# Patient Record
Sex: Male | Born: 1970 | Hispanic: Yes | Marital: Married | Smoking: Former smoker
Health system: Southern US, Community
[De-identification: ages and names within clinical notes are randomized; demographics above are authoritative.]

## PROBLEM LIST (undated history)

## (undated) ENCOUNTER — Ambulatory Visit: Admission: EM | Payer: BC Managed Care – PPO | Source: Home / Self Care

## (undated) DIAGNOSIS — I10 Essential (primary) hypertension: Secondary | ICD-10-CM

## (undated) DIAGNOSIS — Z21 Asymptomatic human immunodeficiency virus [HIV] infection status: Secondary | ICD-10-CM

## (undated) DIAGNOSIS — B20 Human immunodeficiency virus [HIV] disease: Secondary | ICD-10-CM

## (undated) HISTORY — DX: Human immunodeficiency virus (HIV) disease: B20

## (undated) HISTORY — DX: Asymptomatic human immunodeficiency virus (hiv) infection status: Z21

## (undated) HISTORY — DX: Essential (primary) hypertension: I10

---

## 1997-06-10 HISTORY — PX: APPENDECTOMY: SHX54

## 2018-07-29 ENCOUNTER — Other Ambulatory Visit: Payer: Self-pay

## 2018-07-29 ENCOUNTER — Ambulatory Visit: Payer: Self-pay

## 2018-07-29 DIAGNOSIS — Z79899 Other long term (current) drug therapy: Secondary | ICD-10-CM

## 2018-07-29 DIAGNOSIS — B20 Human immunodeficiency virus [HIV] disease: Secondary | ICD-10-CM

## 2018-07-29 DIAGNOSIS — Z113 Encounter for screening for infections with a predominantly sexual mode of transmission: Secondary | ICD-10-CM

## 2018-08-03 ENCOUNTER — Ambulatory Visit: Payer: Medicaid Other

## 2018-08-03 ENCOUNTER — Ambulatory Visit (INDEPENDENT_AMBULATORY_CARE_PROVIDER_SITE_OTHER): Payer: Medicaid Other | Admitting: Pharmacist

## 2018-08-03 ENCOUNTER — Other Ambulatory Visit (HOSPITAL_COMMUNITY)
Admission: RE | Admit: 2018-08-03 | Discharge: 2018-08-03 | Disposition: A | Discharge status: Home / Self Care | Payer: Medicaid Other | Source: Ambulatory Visit | Attending: Internal Medicine | Admitting: Internal Medicine

## 2018-08-03 ENCOUNTER — Encounter: Payer: Self-pay | Admitting: Internal Medicine

## 2018-08-03 ENCOUNTER — Other Ambulatory Visit: Payer: Medicaid Other

## 2018-08-03 DIAGNOSIS — Z113 Encounter for screening for infections with a predominantly sexual mode of transmission: Secondary | ICD-10-CM | POA: Insufficient documentation

## 2018-08-03 DIAGNOSIS — B20 Human immunodeficiency virus [HIV] disease: Secondary | ICD-10-CM

## 2018-08-03 DIAGNOSIS — Z79899 Other long term (current) drug therapy: Secondary | ICD-10-CM

## 2018-08-03 LAB — URINALYSIS
BILIRUBIN URINE: NEGATIVE
Glucose, UA: NEGATIVE
Hgb urine dipstick: NEGATIVE
Ketones, ur: NEGATIVE
Leukocytes,Ua: NEGATIVE
Nitrite: NEGATIVE
Protein, ur: NEGATIVE
Specific Gravity, Urine: 1.024 (ref 1.001–1.03)
pH: 6 (ref 5.0–8.0)

## 2018-08-03 MED ORDER — EMTRICITABINE-TENOFOVIR AF 200-25 MG PO TABS: 1.0000 | ORAL_TABLET | Freq: Every day | ORAL | 2 refills | Status: DC

## 2018-08-03 MED ORDER — DOLUTEGRAVIR SODIUM 50 MG PO TABS: 50.0000 mg | ORAL_TABLET | Freq: Every day | ORAL | 2 refills | Status: DC

## 2018-08-03 MED FILL — DESCOVY 200-25 MG TABS: 200-25 | 30 days supply | Qty: 30 | Fill #0

## 2018-08-03 MED FILL — TIVICAY 50 MG TABLET: 50 | 30 days supply | Qty: 30 | Fill #0

## 2018-08-03 NOTE — Progress Notes (Signed)
HPI: Mitchell Meyers is a 48 y.o. male who presents to the [facility] as a new patient.  There are no active problems to display for this patient.   Patient's Medications  New Prescriptions   DOLUTEGRAVIR (TIVICAY) 50 MG TABLET    Take 1 tablet (50 mg total) by mouth daily.   EMTRICITABINE-TENOFOVIR AF (DESCOVY) 200-25 MG TABLET    Take 1 tablet by mouth daily.  Previous Medications   No medications on file  Modified Medications   No medications on file  Discontinued Medications   No medications on file    Allergies: Allergies not on file  Past Medical History: No past medical history on file.  Social History: Social History   Socioeconomic History  . Marital status: Married    Spouse name: Not on file  . Number of children: Not on file  . Years of education: Not on file  . Highest education level: Not on file  Occupational History  . Not on file  Social Needs  . Financial resource strain: Not on file  . Food insecurity:    Worry: Not on file    Inability: Not on file  . Transportation needs:    Medical: Not on file    Non-medical: Not on file  Tobacco Use  . Smoking status: Not on file  Substance and Sexual Activity  . Alcohol use: Not on file  . Drug use: Not on file  . Sexual activity: Not on file  Lifestyle  . Physical activity:    Days per week: Not on file    Minutes per session: Not on file  . Stress: Not on file  Relationships  . Social connections:    Talks on phone: Not on file    Gets together: Not on file    Attends religious service: Not on file    Active member of club or organization: Not on file    Attends meetings of clubs or organizations: Not on file    Relationship status: Not on file  Other Topics Concern  . Not on file  Social History Narrative  . Not on file    Labs: No results found for: HIV1RNAQUANT, HIV1RNAVL, CD4TABS  RPR and STI No results found for: LABRPR, RPRTITER  No flowsheet data found.  Hepatitis B No  results found for: HEPBSAB, HEPBSAG, HEPBCAB Hepatitis C No results found for: HEPCAB, HCVRNAPCRQN Hepatitis A No results found for: HAV Lipids: No results found for: CHOL, TRIG, HDL, CHOLHDL, VLDL, LDLCALC  Current HIV Regimen: Tivicay + Descovy  Assessment: Mitchell Meyers is here today to establish care at [facility] as a transfer patient.  He is here today to get financial assistance and labs but asked to see me to get medication refills.  He is currently taking Tivicay and Descovy but asked that I just send in Tivicay refills.  I asked why and he states that he has been taking only Tivicay for the last 3 months. I asked him if it was because he ran out of Descovy and he said no that he was "just trying something new".  I explained how dangerous that was and that he can never take one medication without the other.  He just shrugged his shoulders and stated "whatever you say". I again emphasized the issue with taking one without the other.  I was going to switch him to [employer] but had no information on patient and wanted to wait until labs with resistance came back.  I will defer  to Dr. Luciana Axe to see if he needs a whole new regimen if he has developed resistance with only taking Tivicay.   I told him to take both until he sees Dr. Luciana Axe and he agreed to do so.  He is insured with Medicaid, so I will set him up to get his medications at [facility] through the mail.  Plan: - Take Tivicay and Descovy every day - F/u with Dr. Luciana Axe for a new patient visit on 3/17 at 9am  Cassie L. Kuppelweiser, PharmD, BCIDP, AAHIVP, CPP Infectious Diseases Clinical Pharmacist [facility] 08/03/2018, 10:47 AM

## 2018-08-04 LAB — URINE CYTOLOGY ANCILLARY ONLY
Chlamydia: NEGATIVE
Neisseria Gonorrhea: NEGATIVE

## 2018-08-04 LAB — T-HELPER CELL (CD4) - (RCID CLINIC ONLY)
CD4 % Helper T Cell: 44 % (ref 33–55)
CD4 T Cell Abs: 660 /uL (ref 400–2700)

## 2018-08-11 LAB — HIV ANTIBODY (ROUTINE TESTING W REFLEX): HIV 1&2 Ab, 4th Generation: REACTIVE — AB

## 2018-08-11 LAB — CBC WITH DIFFERENTIAL/PLATELET
Absolute Monocytes: 372 cells/uL (ref 200–950)
Basophils Absolute: 32 cells/uL (ref 0–200)
Basophils Relative: 0.5 %
Eosinophils Absolute: 120 cells/uL (ref 15–500)
Eosinophils Relative: 1.9 %
HCT: 41.2 % (ref 38.5–50.0)
Hemoglobin: 14.3 g/dL (ref 13.2–17.1)
Lymphs Abs: 1525 cells/uL (ref 850–3900)
MCH: 31.1 pg (ref 27.0–33.0)
MCHC: 34.7 g/dL (ref 32.0–36.0)
MCV: 89.6 fL (ref 80.0–100.0)
MPV: 9.7 fL (ref 7.5–12.5)
Monocytes Relative: 5.9 %
Neutro Abs: 4253 cells/uL (ref 1500–7800)
Neutrophils Relative %: 67.5 %
Platelets: 318 10*3/uL (ref 140–400)
RBC: 4.6 10*6/uL (ref 4.20–5.80)
RDW: 13 % (ref 11.0–15.0)
Total Lymphocyte: 24.2 %
WBC: 6.3 10*3/uL (ref 3.8–10.8)

## 2018-08-11 LAB — HLA B*5701: HLA-B*5701 w/rflx HLA-B High: NEGATIVE

## 2018-08-11 LAB — QUANTIFERON-TB GOLD PLUS
Mitogen-NIL: >10 IU/mL
NIL: 0.02 IU/mL
QuantiFERON-TB Gold Plus: NEGATIVE
TB1-NIL: 0.01 IU/mL
TB2-NIL: 0.02 IU/mL

## 2018-08-11 LAB — LIPID PANEL
Cholesterol: 202 mg/dL — ABNORMAL HIGH (ref <200)
HDL: 34 mg/dL — ABNORMAL LOW (ref >=40)
LDL Cholesterol (Calc): 145 mg/dL (calc) — ABNORMAL HIGH
Non-HDL Cholesterol (Calc): 168 mg/dL (calc) — ABNORMAL HIGH (ref <130)
Total CHOL/HDL Ratio: 5.9 (calc) — ABNORMAL HIGH (ref <5.0)
Triglycerides: 112 mg/dL (ref <150)

## 2018-08-11 LAB — HIV-1/2 AB - DIFFERENTIATION
HIV-1 antibody: POSITIVE — AB
HIV-2 Ab: NEGATIVE

## 2018-08-11 LAB — COMPLETE METABOLIC PANEL WITH GFR
AG Ratio: 1.1 (calc) (ref 1.0–2.5)
ALBUMIN MSPROF: 4 g/dL (ref 3.6–5.1)
ALKALINE PHOSPHATASE (APISO): 54 U/L (ref 36–130)
ALT: 17 U/L (ref 9–46)
AST: 15 U/L (ref 10–40)
BUN: 12 mg/dL (ref 7–25)
CO2: 28 mmol/L (ref 20–32)
Calcium: 9.4 mg/dL (ref 8.6–10.3)
Chloride: 103 mmol/L (ref 98–110)
Creat: 1.15 mg/dL (ref 0.60–1.35)
GFR, Est African American: 87 mL/min/{1.73_m2} (ref >=60)
GFR, Est Non African American: 75 mL/min/{1.73_m2} (ref >=60)
GLOBULIN: 3.8 g/dL — AB (ref 1.9–3.7)
Glucose, Bld: 97 mg/dL (ref 65–99)
Potassium: 4.5 mmol/L (ref 3.5–5.3)
Sodium: 138 mmol/L (ref 135–146)
Total Bilirubin: 0.3 mg/dL (ref 0.2–1.2)
Total Protein: 7.8 g/dL (ref 6.1–8.1)

## 2018-08-11 LAB — HEPATITIS B SURFACE ANTIGEN: Hepatitis B Surface Ag: NONREACTIVE

## 2018-08-11 LAB — HEPATITIS C ANTIBODY
Hepatitis C Ab: NONREACTIVE
SIGNAL TO CUT-OFF: 0.03 (ref <1.00)

## 2018-08-11 LAB — HEPATITIS B SURFACE ANTIBODY,QUALITATIVE: Hep B S Ab: REACTIVE — AB

## 2018-08-11 LAB — HIV-1 RNA ULTRAQUANT REFLEX TO GENTYP+
HIV 1 RNA QUANT: NOT DETECTED {copies}/mL
HIV-1 RNA QUANT, LOG: NOT DETECTED {Log_copies}/mL

## 2018-08-11 LAB — HEPATITIS A ANTIBODY, TOTAL: HEPATITIS A AB,TOTAL: REACTIVE — AB

## 2018-08-11 LAB — RPR: RPR Ser Ql: NONREACTIVE

## 2018-08-11 LAB — HEPATITIS B CORE ANTIBODY, TOTAL: Hep B Core Total Ab: NONREACTIVE

## 2018-08-25 ENCOUNTER — Other Ambulatory Visit: Payer: Self-pay

## 2018-08-25 ENCOUNTER — Telehealth: Payer: Self-pay | Admitting: Pharmacy Technician

## 2018-08-25 ENCOUNTER — Ambulatory Visit: Payer: Medicaid Other | Admitting: Internal Medicine

## 2018-08-25 ENCOUNTER — Ambulatory Visit: Payer: Medicaid Other | Admitting: Pharmacist

## 2018-08-25 ENCOUNTER — Encounter: Payer: Self-pay | Admitting: Internal Medicine

## 2018-08-25 VITALS — BP 128/87 | HR 66 | Temp 97.3°F | Wt 224.0 lb

## 2018-08-25 DIAGNOSIS — Z23 Encounter for immunization: Secondary | ICD-10-CM | POA: Diagnosis not present

## 2018-08-25 DIAGNOSIS — B20 Human immunodeficiency virus [HIV] disease: Secondary | ICD-10-CM

## 2018-08-25 MED ORDER — EMTRICITABINE-TENOFOVIR AF 200-25 MG PO TABS: 1.0000 | ORAL_TABLET | Freq: Every day | ORAL | 11 refills | Status: DC

## 2018-08-25 MED ORDER — DOLUTEGRAVIR SODIUM 50 MG PO TABS: 50.0000 mg | ORAL_TABLET | Freq: Every day | ORAL | 11 refills | Status: DC

## 2018-08-25 NOTE — Progress Notes (Signed)
Patient ID: Mitchell Meyers, male    DOB: 11/11/1970, 48 y.o.   MRN: [id number]  Reason for visit: to establish care as a new patient with HIV  HPI:   Patient was first diagnosed in 2002.  He was tested as part risk factor screening, wife had tested positive.  The CD4 count is 660, viral load < 20.  There have been no associated symptoms.  He is transferring his care here from [place].  He had previously been on Reyataz, norvir, Truvada and changed to [place] and Descovy.  No issues with the medication and no missed doses.  He has no questions regarding HIV. He is currently sexually active with his wife only, monogamous heterosexual.    PMH: HIV  Prior to Admission medications   Medication Sig Start Date End Date Taking? Authorizing Provider  dolutegravir (TIVICAY) 50 MG tablet Take 1 tablet (50 mg total) by mouth daily. 08/25/18  Yes Shritha Bresee, Belia Heman, MD  emtricitabine-tenofovir AF (DESCOVY) 200-25 MG tablet Take 1 tablet by mouth daily. 08/25/18  Yes Zoey Bidwell, Belia Heman, MD    Social History   Tobacco Use  . Smoking status: Current Every Day Smoker  . Smokeless tobacco: Never Used  Substance Use Topics  . Alcohol use: Not Currently  . Drug use: Not Currently  Lives in [place]  [facility]: no renal issues; father recently with cancer, died last year; cardiac disease in parents  Review of Systems Constitutional: negative for fevers, chills, malaise, anorexia and weight loss Respiratory: negative for cough or sputum Gastrointestinal: negative for nausea and diarrhea Integument/breast: negative for rash All other systems reviewed and are negative    CONSTITUTIONAL:in no apparent distress and alert  Vitals:   08/25/18 0922  BP: 128/87  Pulse: 66  Temp: (!) 97.3 F (36.3 C)   EYES: anicteric HENT: no thrush CARD:Cor RRR RESP:CTA B; normal respiratory effort GI: soft, nt MS:no pedal edema noted SKIN:no rashes NEURO: non-focal  Lab Results  Component Value Date   HIV1RNAQUANT <20 NOT  DETECTED 08/03/2018   No components found for: HIV1GENOTYPRPLUS No components found for: THELPERCELL  Assessment: new patient here with established HIV.  Discussed with patient treatment options and side effects, benefits of treatment, long term outcomes.  I discussed the severity of untreated HIV including higher cancer risk, opportunistic infections, renal failure.  Also discussed needing to use condoms, partner disclosure, necessary vaccines, blood monitoring.  He had no questions.    Plan: 1) continue Tivicay and Descovy, refills sent 2) Menveo #2 and Prevnar - record reviewed and he has not previously had any pneumococcal vaccines.  He did have Menveo #1, hepatitis A and B series.   RTC 6 months.  Can do Pneumovax then

## 2018-08-25 NOTE — Telephone Encounter (Signed)
[facility] Patient Advocate Encounter    Findings of the benefits investigation conducted this morning via test claims for the patient's upcoming appointment on 08/25/2018 @ 10:00am are as follows:   Insurance: [place] [employer] (remains active until 07/11/2019 Test run with drug historically used, will run another test claim if new drug prescribed Estimated copay amount: $3.00 Prior Authorization: not required at this time Medication last mailed to patient 08/03/18  [facility] Patient Advocate will follow up once patient arrives for their appointment.  Beulah Gandy, CPhT Specialty Pharmacy Patient [facility] Phone: [phone] Fax: [phone] 08/25/2018 8:34 AM

## 2018-09-01 MED FILL — DESCOVY 200-25 MG TABS: 200-25 | 30 days supply | Qty: 30 | Fill #1

## 2018-09-01 MED FILL — TIVICAY 50 MG TABLET: 50 | 30 days supply | Qty: 30 | Fill #1

## 2018-09-11 ENCOUNTER — Encounter: Payer: Self-pay | Admitting: Internal Medicine

## 2018-10-01 DIAGNOSIS — M1991 Primary osteoarthritis, unspecified site: Secondary | ICD-10-CM

## 2018-10-01 MED FILL — TIVICAY 50 MG TABLET: 50 | 30 days supply | Qty: 30 | Fill #2

## 2018-10-01 MED FILL — DESCOVY 200-25 MG TABS: 200-25 | 30 days supply | Qty: 30 | Fill #2

## 2018-10-02 ENCOUNTER — Other Ambulatory Visit: Payer: Self-pay | Admitting: Internal Medicine

## 2018-10-02 MED ORDER — CLOTRIMAZOLE 1 % EX CREA: 1.0000 "application " | TOPICAL_CREAM | Freq: Two times a day (BID) | CUTANEOUS | 0 refills | Status: DC

## 2018-10-02 MED FILL — CLOTRIMAZOLE 1% CREAM: 1 | 30 days supply | Qty: 30 | Fill #0

## 2018-10-05 MED ORDER — DICLOFENAC SODIUM 1 % TD GEL: 2.0000 g | Freq: Four times a day (QID) | TRANSDERMAL | 1 refills | Status: DC

## 2018-10-07 ENCOUNTER — Telehealth: Payer: Self-pay

## 2018-10-07 MED FILL — DICLOFENAC SODIUM 1 % GEL: 1 | 12 days supply | Qty: 100 | Fill #0

## 2018-10-07 NOTE — Telephone Encounter (Signed)
Received fax from [facility] outpatient informing us about need to initiate prior auth for  [place] for Diclofenac1% gel.   Called [place] [employer]x to initiate PA.   PA approved.  Approval # [id number] Ref# [id number]   Gerarda Fraction, CMA

## 2018-10-22 ENCOUNTER — Ambulatory Visit (INDEPENDENT_AMBULATORY_CARE_PROVIDER_SITE_OTHER): Payer: Medicaid Other | Admitting: Otolaryngology

## 2018-10-26 MED FILL — DESCOVY 200-25 MG TABS: 200-25 | 30 days supply | Qty: 30 | Fill #0

## 2018-10-26 MED FILL — TIVICAY 50 MG TABLET: 50 | 30 days supply | Qty: 30 | Fill #0

## 2018-11-10 ENCOUNTER — Other Ambulatory Visit: Payer: Self-pay

## 2018-11-10 ENCOUNTER — Other Ambulatory Visit: Payer: Medicaid Other

## 2018-11-10 DIAGNOSIS — Z20822 Contact with and (suspected) exposure to covid-19: Secondary | ICD-10-CM

## 2018-11-12 LAB — NOVEL CORONAVIRUS, NAA: SARS-CoV-2, NAA: NOT DETECTED

## 2018-11-25 MED FILL — TIVICAY 50 MG TABLET: 50 | 30 days supply | Qty: 30 | Fill #1

## 2018-11-25 MED FILL — DESCOVY 200-25 MG TABS: 200-25 | 30 days supply | Qty: 30 | Fill #1

## 2018-11-25 MED FILL — DICLOFENAC SODIUM 1 % GEL: 1 | 12 days supply | Qty: 100 | Fill #1

## 2018-12-24 MED FILL — DESCOVY 200-25 MG TABS: 200-25 | 30 days supply | Qty: 30 | Fill #2

## 2018-12-24 MED FILL — TIVICAY 50 MG TABLET: 50 | 30 days supply | Qty: 30 | Fill #2

## 2018-12-29 ENCOUNTER — Other Ambulatory Visit: Payer: Self-pay | Admitting: Internal Medicine

## 2018-12-29 ENCOUNTER — Other Ambulatory Visit: Payer: Self-pay | Admitting: *Deleted

## 2018-12-29 DIAGNOSIS — R21 Rash and other nonspecific skin eruption: Secondary | ICD-10-CM

## 2018-12-29 MED ORDER — CLOTRIMAZOLE 1 % EX CREA: 1.0000 "application " | TOPICAL_CREAM | Freq: Two times a day (BID) | CUTANEOUS | 1 refills | Status: DC

## 2019-01-27 ENCOUNTER — Other Ambulatory Visit: Payer: Self-pay | Admitting: Internal Medicine

## 2019-01-27 DIAGNOSIS — M1991 Primary osteoarthritis, unspecified site: Secondary | ICD-10-CM

## 2019-01-27 MED FILL — DESCOVY 200-25 MG TABS: 200-25 | 30 days supply | Qty: 30 | Fill #3

## 2019-01-27 MED FILL — TIVICAY 50 MG TABLET: 50 | 30 days supply | Qty: 30 | Fill #3

## 2019-02-24 ENCOUNTER — Other Ambulatory Visit: Payer: Medicaid Other

## 2019-02-24 MED FILL — DESCOVY 200-25 MG TABS: 200-25 | 30 days supply | Qty: 30 | Fill #4

## 2019-02-24 MED FILL — TIVICAY 50 MG TABLET: 50 | 30 days supply | Qty: 30 | Fill #4

## 2019-02-24 MED FILL — DICLOFENAC SODIUM 1 % GEL: 1 | 12 days supply | Qty: 100 | Fill #0

## 2019-02-25 ENCOUNTER — Other Ambulatory Visit: Payer: Medicaid Other

## 2019-02-25 ENCOUNTER — Other Ambulatory Visit: Payer: Self-pay

## 2019-02-25 DIAGNOSIS — B20 Human immunodeficiency virus [HIV] disease: Secondary | ICD-10-CM

## 2019-02-25 DIAGNOSIS — Z125 Encounter for screening for malignant neoplasm of prostate: Secondary | ICD-10-CM

## 2019-02-25 NOTE — Progress Notes (Signed)
Received verbal order per Dr. Linus Salmons ok for PSA lab test to screen for prostate cancer. Made patient aware via MyChart message. Also advised the importance of seeking PCP.  Mitchell Meyers

## 2019-03-15 ENCOUNTER — Encounter: Payer: Self-pay | Admitting: Internal Medicine

## 2019-03-15 ENCOUNTER — Ambulatory Visit (INDEPENDENT_AMBULATORY_CARE_PROVIDER_SITE_OTHER): Payer: Medicaid Other | Admitting: Internal Medicine

## 2019-03-15 ENCOUNTER — Other Ambulatory Visit: Payer: Self-pay

## 2019-03-15 VITALS — BP 147/95 | HR 93 | Temp 97.9°F

## 2019-03-15 DIAGNOSIS — Z23 Encounter for immunization: Secondary | ICD-10-CM | POA: Diagnosis not present

## 2019-03-15 DIAGNOSIS — B20 Human immunodeficiency virus [HIV] disease: Secondary | ICD-10-CM

## 2019-03-15 DIAGNOSIS — M1991 Primary osteoarthritis, unspecified site: Secondary | ICD-10-CM

## 2019-03-15 DIAGNOSIS — M159 Polyosteoarthritis, unspecified: Secondary | ICD-10-CM

## 2019-03-15 DIAGNOSIS — M1990 Unspecified osteoarthritis, unspecified site: Secondary | ICD-10-CM | POA: Insufficient documentation

## 2019-03-15 MED ORDER — DICLOFENAC SODIUM 1 % TD GEL: TRANSDERMAL | 1 refills | Status: DC

## 2019-03-15 MED ORDER — NAPROXEN 500 MG PO TABS: 500.0000 mg | ORAL_TABLET | Freq: Two times a day (BID) | ORAL | 2 refills | Status: DC

## 2019-03-15 NOTE — Assessment & Plan Note (Signed)
Doing well on his current regimen.  No changes.  Can consider Biktarvy next visit.  rtc 6 months unless concerns on today's labs

## 2019-03-15 NOTE — Assessment & Plan Note (Signed)
I will refill votaren and naprosyn for now until he is established with rheumatology.

## 2019-03-15 NOTE — Progress Notes (Signed)
   Subjective:    Patient ID: Mitchell Meyers, male    DOB: Jun 18, 1970, 48 y.o.   MRN: [id number]  HPI Here for follow up of HIV He continues on Tivicay and Descovy and denies any missed doses.  No labs prior to the visit.  No associated n/v/d.  Asking for refills on voltaren and naprosyn, has an appt with a PCP in December.  No new events.     Review of Systems  Constitutional: Negative for fatigue.  Gastrointestinal: Negative for diarrhea.  Skin: Negative for rash.  Neurological: Negative for dizziness.       Objective:   Physical Exam Constitutional:      Appearance: Normal appearance.  Eyes:     General: No scleral icterus. Cardiovascular:     Rate and Rhythm: Normal rate and regular rhythm.     Heart sounds: No murmur.  Pulmonary:     Effort: Pulmonary effort is normal. No respiratory distress.     Breath sounds: Normal breath sounds.  Skin:    Findings: No rash.  Neurological:     Mental Status: He is alert.  Psychiatric:        Mood and Affect: Mood normal.    SH: no tobacco       Assessment & Plan:

## 2019-03-15 NOTE — Assessment & Plan Note (Signed)
Offered and given flu shot today Pneumovax next visit

## 2019-03-16 LAB — T-HELPER CELL (CD4) - (RCID CLINIC ONLY)
CD4 % Helper T Cell: 46 % (ref 33–65)
CD4 T Cell Abs: 1031 /uL (ref 400–1790)

## 2019-03-17 LAB — PSA: PSA: 0.6 ng/mL (ref <=4.0)

## 2019-03-17 LAB — HIV-1 RNA QUANT-NO REFLEX-BLD
HIV 1 RNA Quant: <20 copies/mL
HIV-1 RNA Quant, Log: <1.3 Log copies/mL

## 2019-03-23 MED FILL — DESCOVY 200-25 MG TABS: 200-25 | 30 days supply | Qty: 30 | Fill #5

## 2019-03-23 MED FILL — TIVICAY 50 MG TABLET: 50 | 30 days supply | Qty: 30 | Fill #5

## 2019-03-25 NOTE — Progress Notes (Signed)
Office Visit Note  Patient: Mitchell Meyers             Date of Birth: 17-Oct-1970           MRN: [id number]             PCP: Reather ConverseMassenburg, O'Laf, PA-C Referring: Reather ConverseMassenburg, O'Laf, PA-C Visit Date: 04/08/2019 Occupation: Unemployed  Subjective:  Psoriasis and psoriatic arthritis.   History of Present Illness: Mitchell Meyers is a 48 y.o. male seen in consultation per request of his PCP.  According to patient while he was living in [place] he started having pain and swelling in his right third toe.  Gradually the symptoms are spread to his bilateral hands.  He moved to [place] in 2016 and was under care of Dr. Sherrie MustacheFisher who started on some medications which did not work according to patient.  He was in a drug trial which was very effective but had to be discontinued.  He states he took methotrexate but was not very effective.  He was also given gabapentin for neuropathy.  He states his x-ray showed erosive changes.  He has been taking anti-inflammatories and using topical gel.  He states he has not taken any methotrexate in the last 1-1/2-year.  He moved to [place] in February 2020.  He was diagnosed with HIV in 2002.  He has been on treatment since then.  Here he has been under care of Dr. Luciana Axeomer.  He states his viral loads are negligible.  He continues to have some discomfort in his wrist joints, hands, knee joints, ankles and lower back.  He states that he has off-and-on swelling in his knee joints.  Has been also having psoriasis for the last 6 months.  He states he had an appointment with the dermatologist yesterday which she could not keep.  He has been using some over-the-counter steroid creams.  He states the psoriasis is mostly behind his right ear.  Activities of Daily Living:  Patient reports morning stiffness for 24 hours.   Patient Reports nocturnal pain.  Difficulty dressing/grooming: Reports Difficulty climbing stairs: Reports Difficulty getting out of chair: Reports  Difficulty using hands for taps, buttons, cutlery, and/or writing: Reports  Review of Systems  Constitutional: Negative for fatigue and night sweats.  HENT: Positive for mouth dryness. Negative for mouth sores and nose dryness.   Eyes: Negative for redness, itching and dryness.  Respiratory: Negative for shortness of breath, wheezing and difficulty breathing.   Cardiovascular: Negative for chest pain, palpitations, hypertension, irregular heartbeat and swelling in legs/feet.  Gastrointestinal: Negative for abdominal pain, blood in stool, constipation and diarrhea.  Endocrine: Negative for increased urination.  Genitourinary: Negative for difficulty urinating and painful urination.  Musculoskeletal: Positive for arthralgias, joint pain, joint swelling and morning stiffness. Negative for myalgias, muscle weakness, muscle tenderness and myalgias.  Skin: Negative for color change, rash, hair loss, nodules/bumps, skin tightness, ulcers and sensitivity to sunlight.  Allergic/Immunologic: Negative for susceptible to infections.  Neurological: Negative for dizziness, fainting, light-headedness, numbness, headaches, memory loss, night sweats and weakness.  Hematological: Negative for bruising/bleeding tendency and swollen glands.  Psychiatric/Behavioral: Positive for sleep disturbance. Negative for depressed mood and confusion. The patient is not nervous/anxious.     PMFS History:  Patient Active Problem List   Diagnosis Date Noted  . Osteoarthritis 03/15/2019  . Flu vaccine need 03/15/2019  . Human immunodeficiency virus (HIV) disease (HCC) 08/25/2018    History reviewed. No pertinent past medical history.  Family History  Problem Relation Age of Onset  . COPD Mother   . Cancer Father   . Arthritis Father   . Arthritis Sister   . Hypertension Brother   . Hepatitis C Brother   . Healthy Daughter   . Healthy Daughter    Past Surgical History:  Procedure Laterality Date  . APPENDECTOMY   1999   Social History   Social History Narrative  . Not on file   Immunization History  Administered Date(s) Administered  . Influenza,inj,Quad PF,6+ Mos 03/15/2019  . Meningococcal Mcv4o 08/25/2018  . Pneumococcal Conjugate-13 08/25/2018     Objective: Vital Signs: BP 125/84 (BP Location: Right Arm, Patient Position: Sitting, Cuff Size: Normal)   Pulse 87   Resp 15   Ht 5\' 8"  (1.727 m)   Wt 225 lb (102.1 kg)   BMI 34.21 kg/m    Physical Exam Vitals signs and nursing note reviewed.  Constitutional:      Appearance: He is well-developed.  HENT:     Head: Normocephalic and atraumatic.  Eyes:     Conjunctiva/sclera: Conjunctivae normal.     Pupils: Pupils are equal, round, and reactive to light.  Neck:     Musculoskeletal: Normal range of motion and neck supple.  Cardiovascular:     Rate and Rhythm: Normal rate and regular rhythm.     Heart sounds: Normal heart sounds.  Pulmonary:     Effort: Pulmonary effort is normal.     Breath sounds: Normal breath sounds.  Abdominal:     General: Bowel sounds are normal.     Palpations: Abdomen is soft.  Skin:    General: Skin is warm and dry.     Capillary Refill: Capillary refill takes less than 2 seconds.  Neurological:     Mental Status: He is alert and oriented to person, place, and time.  Psychiatric:        Behavior: Behavior normal.      Musculoskeletal Exam: C-spine and thoracic spine with good range of motion.  He has some discomfort range of motion of the lumbar spine which was limited.  No SI joint tenderness was noted.  Shoulder joints and elbow joints were in good range of motion.  He has good range of motion of his wrist joints without any tenderness.  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  Some thickening of the MCP joints was noted.  He has good range of motion of bilateral hip joints and knee joints without any warmth swelling or effusion.  He has tenderness over bilateral  ankle joints with some warmth.  MTPs PIPs and DIPs with good range of motion.  CDAI Exam: CDAI Score: - Patient Global: -; Provider Global: - Swollen: -; Tender: - Joint Exam   No joint exam has been documented for this visit   There is currently no information documented on the homunculus. Go to the Rheumatology activity and complete the homunculus joint exam.  Investigation: No additional findings.  Imaging: Xr Foot 2 Views Left  Result Date: 04/08/2019 PIP and DIP narrowing was noted.  No MTP joint narrowing was noted.  No erosive changes were noted.  No intertarsal tibiotalar joint space narrowing was noted. Impression: These findings are consistent with osteoarthritis of the foot.  Xr Foot 2 Views Right  Result Date: 04/08/2019 Erosive changes were noted in the right fourth and fifth MTP joints.  Right fifth PIP joint erosions were noted.  No significant MTP PIP  or DIP narrowing was noted.  No intertarsal joint space narrowing was noted. 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  Xr Hand 2 View Left  Result Date: 04/08/2019 Second MCP, all PIP and DIP joint space narrowing was noted.  No erosive changes were noted.  No metacarpocarpal, intercarpal radiocarpal joint space narrowing was noted. Impression: These findings are consistent with osteoarthritis and psoriatic arthritis overlap.  Xr Hand 2 View Right  Result Date: 04/08/2019 First MCP narrowing and subluxation was noted.  Juxta-articular osteopenia was noted.  Second and fifth MCP narrowing was noted.  Narrowing of all PIP and DIP joints was noted.  No intercarpal radiocarpal joint space narrowing was noted.  No erosive changes were noted. Impression: These findings are consistent with psoriatic arthritis and osteoarthritis overlap.  Xr Knee 3 View Left  Result Date: 04/08/2019 No medial lateral compartment narrowing was noted.  Mild patellofemoral narrowing was noted.  No  chondrocalcinosis was noted. 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  Xr Knee 3 View Right  Result Date: 04/08/2019 No medial lateral compartment narrowing was noted.  Mild patellofemoral narrowing was noted.  No erosive changes were noted.  No chondrocalcinosis was noted. Impression: These findings are consistent mild chondromalacia patella of the knee.  Xr Lumbar Spine 2-3 Views  Result Date: 04/08/2019 No significant disc space narrowing was noted.  Mild anterior spurring was noted.  Mild facet joint arthropathy was noted.  Wedging of L1 vertebrae was noted.  Which could be a previous compression fracture. Impression: Mild spondylosis of the lumbar spine and facet joint arthropathy.  Xr Pelvis 1-2 Views  Result Date: 04/08/2019 Mild SI joint to sclerosis was noted.  No SI joint narrowing was noted. Impression: These findings are consistent with SI joint  sclerosis.   Recent Labs: Lab Results  Component Value Date   WBC 6.3 08/03/2018   HGB 14.3 08/03/2018   PLT 318 08/03/2018   NA 138 08/03/2018   K 4.5 08/03/2018   CL 103 08/03/2018   CO2 28 08/03/2018   GLUCOSE 97 08/03/2018   BUN 12 08/03/2018   CREATININE 1.15 08/03/2018   BILITOT 0.3 08/03/2018   AST 15 08/03/2018   ALT 17 08/03/2018   PROT 7.8 08/03/2018   CALCIUM 9.4 08/03/2018   GFRAA 87 08/03/2018   QFTBGOLDPLUS NEGATIVE 08/03/2018    Speciality Comments: No specialty comments available.  Procedures:  No procedures performed Allergies: Patient has no known allergies.   Assessment / Plan:     Visit Diagnoses: Psoriatic arthritis (HCC)-patient gives history of psoriatic arthritis for 5 years.  He states he has been treated with methotrexate in the past which was not effective.  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  He complains of discomfort in his knee joints ankles and feet.  Not much synovitis was  noted.  There was no Achilles tendinitis or plantar fasciitis.  There is no history of iritis.  We discussed possible use of Otezla.  Due to history of HIV this will be safest drug for him.  Indications side effects contraindications were discussed.  If approved we will apply for [facility].  He has been taking NSAIDs.  I will check CBC and CMP today.  I discouraged the use of NSAIDs on a regular basis.  Psoriasis-patient states that he had a small patch behind his right ear.  It resolved after using some topical agents prescribed  by his PCP.  No active lesions were noted today.  Pain in both hands -he complains of a lot of pain and discomfort in his hands.  Plan: XR Hand 2 View Right, XR Hand 2 View Left.  X-rays were consistent with psoriatic arthritis and osteoarthritis overlap with no erosive changes.  Pain in both feet -he complains of intermittent discomfort in his feet and ankle joints.  Plan: XR Foot 2 Views Right, XR Foot 2 Views Left.  Erosive changes in the right foot were noted.  Chronic pain of both knees -he gives history of intermittent swelling in his knee joints.  Plan: XR KNEE 3 VIEW RIGHT, XR KNEE 3 VIEW LEFT.  Knee joints showed mild chondromalacia patella bilaterally.  Chronic midline low back pain without sciatica - Plan: XR Pelvis 1-2 Views, XR Lumbar Spine 2-3 Views.  Lumbar spine x-rays were unremarkable.  Mild wedging of L1 vertebrae was noted.  Which could be prior injury.  Human immunodeficiency virus (HIV) disease (HCC)-he is followed by Dr. Luciana Axe.  Insomnia secondary to anxiety  Orders: Orders Placed This Encounter  Procedures  . XR KNEE 3 VIEW RIGHT  . XR KNEE 3 VIEW LEFT  . XR Hand 2 View Right  . XR Hand 2 View Left  . XR Foot 2 Views Right  . XR Foot 2 Views Left  . XR Pelvis 1-2 Views  . XR Lumbar Spine 2-3 Views  . CBC with Differential/Platelet  . COMPLETE METABOLIC PANEL WITH GFR   No orders of the defined types were placed in this encounter.    Face-to-face time spent with patient was50 minutes. Greater than 50% of time was spent in counseling and coordination of care.  Follow-Up Instructions: Return for Psoriatic arthritis and psoriasis.   Pollyann Savoy, MD  Note - This record has been created using Animal nutritionist.  Chart creation errors have been sought, but may not always  have been located. Such creation errors do not reflect on  the standard of medical care.

## 2019-03-29 NOTE — Telephone Encounter (Signed)
Unable to connect to [facility] via [place] (Patient not found).  Will need to contact them at [phone]. Landis Gandy, RN

## 2019-04-08 ENCOUNTER — Ambulatory Visit (INDEPENDENT_AMBULATORY_CARE_PROVIDER_SITE_OTHER): Payer: Medicaid Other

## 2019-04-08 ENCOUNTER — Ambulatory Visit: Payer: Self-pay

## 2019-04-08 ENCOUNTER — Telehealth: Payer: Self-pay | Admitting: Pharmacist

## 2019-04-08 ENCOUNTER — Ambulatory Visit: Payer: Medicaid Other | Admitting: Rheumatology

## 2019-04-08 ENCOUNTER — Encounter: Payer: Self-pay | Admitting: Rheumatology

## 2019-04-08 ENCOUNTER — Other Ambulatory Visit: Payer: Self-pay

## 2019-04-08 VITALS — BP 125/84 | HR 87 | Resp 15 | Ht 68.0 in | Wt 225.0 lb

## 2019-04-08 DIAGNOSIS — L409 Psoriasis, unspecified: Secondary | ICD-10-CM | POA: Diagnosis not present

## 2019-04-08 DIAGNOSIS — G8929 Other chronic pain: Secondary | ICD-10-CM

## 2019-04-08 DIAGNOSIS — M545 Low back pain, unspecified: Secondary | ICD-10-CM

## 2019-04-08 DIAGNOSIS — M25562 Pain in left knee: Secondary | ICD-10-CM

## 2019-04-08 DIAGNOSIS — L4050 Arthropathic psoriasis, unspecified: Secondary | ICD-10-CM | POA: Diagnosis not present

## 2019-04-08 DIAGNOSIS — F419 Anxiety disorder, unspecified: Secondary | ICD-10-CM

## 2019-04-08 DIAGNOSIS — M79641 Pain in right hand: Secondary | ICD-10-CM

## 2019-04-08 DIAGNOSIS — M79672 Pain in left foot: Secondary | ICD-10-CM

## 2019-04-08 DIAGNOSIS — M79671 Pain in right foot: Secondary | ICD-10-CM | POA: Diagnosis not present

## 2019-04-08 DIAGNOSIS — M25561 Pain in right knee: Secondary | ICD-10-CM

## 2019-04-08 DIAGNOSIS — M1991 Primary osteoarthritis, unspecified site: Secondary | ICD-10-CM

## 2019-04-08 DIAGNOSIS — B20 Human immunodeficiency virus [HIV] disease: Secondary | ICD-10-CM

## 2019-04-08 DIAGNOSIS — F5105 Insomnia due to other mental disorder: Secondary | ICD-10-CM

## 2019-04-08 DIAGNOSIS — M79642 Pain in left hand: Secondary | ICD-10-CM | POA: Diagnosis not present

## 2019-04-08 DIAGNOSIS — Z79899 Other long term (current) drug therapy: Secondary | ICD-10-CM

## 2019-04-08 MED ORDER — DICLOFENAC SODIUM 1 % TD GEL: TRANSDERMAL | 1 refills | Status: AC

## 2019-04-08 NOTE — Progress Notes (Signed)
Pharmacy Note  Subjective:  Patient presents today to the [facility] to see Dr. Estanislado Pandy.  Patient was seen by the pharmacist for counseling on Otezla psoriatic arthritis.  Prior treatment includes NSAID's.  Objective: CMP     Component Value Date/Time   NA 138 08/03/2018 1009   K 4.5 08/03/2018 1009   CL 103 08/03/2018 1009   CO2 28 08/03/2018 1009   GLUCOSE 97 08/03/2018 1009   BUN 12 08/03/2018 1009   CREATININE 1.15 08/03/2018 1009   CALCIUM 9.4 08/03/2018 1009   PROT 7.8 08/03/2018 1009   AST 15 08/03/2018 1009   ALT 17 08/03/2018 1009   BILITOT 0.3 08/03/2018 1009   GFRNONAA 75 08/03/2018 1009   GFRAA 87 08/03/2018 1009    CBC    Component Value Date/Time   WBC 6.3 08/03/2018 1009   RBC 4.60 08/03/2018 1009   HGB 14.3 08/03/2018 1009   HCT 41.2 08/03/2018 1009   PLT 318 08/03/2018 1009   MCV 89.6 08/03/2018 1009   MCH 31.1 08/03/2018 1009   MCHC 34.7 08/03/2018 1009   RDW 13.0 08/03/2018 1009   LYMPHSABS 1,525 08/03/2018 1009   EOSABS 120 08/03/2018 1009   BASOSABS 32 08/03/2018 1009    TB GOLD Quantiferon TB Gold Latest Ref Rng & Units 08/03/2018  Quantiferon TB Gold Plus NEGATIVE NEGATIVE   Hepatitis Panel Hepatitis Latest Ref Rng & Units 08/03/2018  Hep B Surface Ag NON-REACTI NON-REACTIVE  Hep C Ab NON-REACTI NON-REACTIVE  Hep C Ab NON-REACTI NON-REACTIVE   HIV Lab Results  Component Value Date   HIV REPEATEDLY REACTIVE (A) 08/03/2018    Assessment/Plan:   He is currently on Tivicay and Descovy for HIV.  No drug interactions identified with these medications and Otezla.  Counseled patient that Rutherford Nail is a PDE 4 inhibitor that works to treat psoriasis and the joint pain and tenderness of psoriatic arthritis.  Counseled patient on purpose, proper use, and adverse effects of Otezla.  Reviewed the most common adverse effects of weight loss, depression, nausea/diarrhea/vomiting, headaches, and nasal congestion.  Advised patient to notify  office of any serious changes in mood and/or thoughts of suicide.  Provided patient with medication education material and answered all questions.  Patient consented to [place].  Will apply for Otezla through patient's insurance and update when we receive a response.  Patient dose will be Otezla starter titration pack and then 30 mg twice daily.  Prescription pending insurance approval and once approved patient may pick up sample for starter pack from our office.  Patient verbalized understanding.  All questions encouraged and answered.  Instructed patient to call with any other questions or concerns.

## 2019-04-08 NOTE — Telephone Encounter (Signed)
Please start BIV for Otezla.  He has tried NSAID's only and is HIV positive making him a poor candidate for DMARD's and biologics.   Mariella Saa, PharmD, [place], [place] Clinical Specialty Pharmacist [phone]  04/08/2019 2:27 PM

## 2019-04-08 NOTE — Addendum Note (Signed)
Addended by: Mariella Saa C on: 04/08/2019 02:27 PM   Modules accepted: Orders

## 2019-04-08 NOTE — Telephone Encounter (Signed)
Submitted a Prior Authorization request to [facility] Medicaid for [place] via [employer]. Will update once we receive a response.  Conf# [id number]

## 2019-04-09 ENCOUNTER — Encounter: Payer: Self-pay | Admitting: Rheumatology

## 2019-04-09 LAB — CBC WITH DIFFERENTIAL/PLATELET
Absolute Monocytes: 605 cells/uL (ref 200–950)
Basophils Absolute: 18 cells/uL (ref 0–200)
Basophils Relative: 0.2 %
Eosinophils Absolute: 187 cells/uL (ref 15–500)
Eosinophils Relative: 2.1 %
HCT: 39.3 % (ref 38.5–50.0)
Hemoglobin: 13.3 g/dL (ref 13.2–17.1)
Lymphs Abs: 1931 cells/uL (ref 850–3900)
MCH: 29.9 pg (ref 27.0–33.0)
MCHC: 33.8 g/dL (ref 32.0–36.0)
MCV: 88.3 fL (ref 80.0–100.0)
MPV: 9.4 fL (ref 7.5–12.5)
Monocytes Relative: 6.8 %
Neutro Abs: 6159 cells/uL (ref 1500–7800)
Neutrophils Relative %: 69.2 %
Platelets: 364 10*3/uL (ref 140–400)
RBC: 4.45 10*6/uL (ref 4.20–5.80)
RDW: 12.7 % (ref 11.0–15.0)
Total Lymphocyte: 21.7 %
WBC: 8.9 10*3/uL (ref 3.8–10.8)

## 2019-04-09 LAB — COMPLETE METABOLIC PANEL WITH GFR
AG Ratio: 0.9 (calc) — ABNORMAL LOW (ref 1.0–2.5)
ALT: 11 U/L (ref 9–46)
AST: 13 U/L (ref 10–40)
Albumin: 3.8 g/dL (ref 3.6–5.1)
Alkaline phosphatase (APISO): 59 U/L (ref 36–130)
BUN: 17 mg/dL (ref 7–25)
CO2: 26 mmol/L (ref 20–32)
Calcium: 9.5 mg/dL (ref 8.6–10.3)
Chloride: 104 mmol/L (ref 98–110)
Creat: 1.13 mg/dL (ref 0.60–1.35)
GFR, Est African American: 89 mL/min/{1.73_m2} (ref >=60)
GFR, Est Non African American: 76 mL/min/{1.73_m2} (ref >=60)
Globulin: 4.3 g/dL (calc) — ABNORMAL HIGH (ref 1.9–3.7)
Glucose, Bld: 88 mg/dL (ref 65–99)
Potassium: 4.2 mmol/L (ref 3.5–5.3)
Sodium: 138 mmol/L (ref 135–146)
Total Bilirubin: 0.3 mg/dL (ref 0.2–1.2)
Total Protein: 8.1 g/dL (ref 6.1–8.1)

## 2019-04-09 NOTE — Telephone Encounter (Signed)
Patient advised that the LFTs are WNL.

## 2019-04-19 MED ORDER — APREMILAST 10 & 20 & 30 MG PO TBPK: ORAL_TABLET | ORAL | 0 refills | Status: DC

## 2019-04-19 NOTE — Telephone Encounter (Signed)
Received notification from [facility] Medicaid regarding a prior authorization for [place]. Authorization has been APPROVED from 04/08/19 to 04/07/20.   Will send document to scan center.  Authorization # [id number]   10:35 AM Beatriz Chancellor, CPhT

## 2019-04-19 NOTE — Telephone Encounter (Signed)
Scheduled patient's 1st shipment of Otezla from [facility] to deliver to patient on 04/21/2019.

## 2019-04-19 NOTE — Telephone Encounter (Signed)
Patient notified of approval.  He declined a sample starter pack as he does not live close to [employer].  Prescription sent to [facility] and transferred to Frederik Schmidt, CPhT to schedule shipment.  Mariella Saa, PharmD, [place], [place] Clinical Specialty Pharmacist [phone]  04/19/2019 11:27 AM

## 2019-04-20 MED FILL — DESCOVY 200-25 MG TABS: 200-25 | 30 days supply | Qty: 30 | Fill #6

## 2019-04-20 MED FILL — OTEZLA 10 & 20 & 30 MG TBPK: 10 & 20 & 3 | 28 days supply | Qty: 55 | Fill #0

## 2019-04-20 MED FILL — TIVICAY 50 MG TABLET: 50 | 30 days supply | Qty: 30 | Fill #6

## 2019-04-21 NOTE — Progress Notes (Signed)
Virtual Visit via Telephone Note  I connected with Mitchell Meyers on 05/05/19 at  9:30 AM EST by telephone and verified that I am speaking with the correct person using two identifiers.  Location: Patient: Home Provider: Clinic   This service was conducted via virtual visit.  The patient was located at home. I was located in my office.  Consent was obtained prior to the virtual visit and is aware of possible charges through their insurance for this visit.  The patient is an established patient.  Dr. Estanislado Pandy, MD conducted the virtual visit and Hazel Sams, PA-C acted as scribe during the service.  Office staff helped with scheduling follow up visits after the service was conducted.     I discussed the limitations, risks, security and privacy concerns of performing an evaluation and management service by telephone and the availability of in person appointments. I also discussed with the patient that there may be a patient responsible charge related to this service. The patient expressed understanding and agreed to proceed.  CC: Pain in multiple joints  History of Present Illness: Patient is a 48 year old male with a past medical history of psoriatic arthritis and osteoarthritis.  He has not started on Otezla due to wanting to clarify a few question about the purpose of the medication.  He plans on starting today.  He continues to have ongoing back pain.  He has chronic pain in both knee joints.  He uses voltaren gel topically as needed.  He has started having increased discomfort in both hands and both feet.  He denies any joint swelling currently.   Review of Systems  Constitutional: Negative for fever and malaise/fatigue.  HENT:       +Dry mouth  Eyes: Negative for photophobia, pain, discharge and redness.  Respiratory: Negative for cough, shortness of breath and wheezing.   Cardiovascular: Negative for chest pain and palpitations.  Gastrointestinal: Negative for blood in stool,  constipation and diarrhea.  Genitourinary: Negative for dysuria.  Musculoskeletal: Positive for back pain and joint pain. Negative for myalgias and neck pain.  Skin: Negative for rash.  Neurological: Negative for dizziness and headaches.  Psychiatric/Behavioral: Negative for depression. The patient has insomnia. The patient is not nervous/anxious.       Observations/Objective: Physical Exam  Constitutional: He is oriented to person, place, and time.  Neurological: He is alert and oriented to person, place, and time.  Psychiatric: Mood, memory, affect and judgment normal.   Patient reports morning stiffness for 2  minutes.   Patient denies nocturnal pain.  Difficulty dressing/grooming: Denies Difficulty climbing stairs: Reports Difficulty getting out of chair: Reports Difficulty using hands for taps, buttons, cutlery, and/or writing: Denies   Assessment and Plan: Visit Diagnoses: Psoriatic arthritis (HCC)-History of psoriatic arthritis for 5 years.  Inadequate response to MTX in the past.  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 He has ongoing pain in both hands, both knee joints, and both feet.  He reports no joint swelling currently.  He has morning stiffness lasting about 2 minutes. His psoriasis has resolved.  He wanted to discuss Rutherford Nail again before restarting it.  We discussed the indications, contraindications, and potential side effects of Otezla.  He plans on starting the Otezla starter pack today.  He will notify us if he cannot tolerate otezla.  He was advised to notify us if he develops increased joint pain or joint swelling.  He will follow up in 3 months.   Psoriasis-Resolved   High risk medication use: He plans on starting on Otezla starter pack today.  Primary osteoarthritis of both hands: He continues to have pain in both hands.  No joint swelling.  He has incomplete fist formation due to prior damage.     Chondromalacia of both patellae: He has chronic pain in both knee joints.  No joint swelling.  He has difficulty climbing steps and getting up from chair at times due to the discomfort.   Chronic midline low back pain without sciatica -   Lumbar spine x-rays were unremarkable.  Mild wedging of L1 vertebrae was noted-Could be prior injury.  He has ongoing lower back pain.  We will mail back exercises to the patient.   Human immunodeficiency virus (HIV) disease (HCC)-He is followed by Dr. Luciana Axe.  Patient states he got approval to start on Otezla from Dr. Ronnie Derby.  Insomnia secondary to anxiety  Follow Up Instructions: He will follow up in 3 months.     I discussed the assessment and treatment plan with the patient. The patient was provided an opportunity to ask questions and all were answered. The patient agreed with the plan and demonstrated an understanding of the instructions.   The patient was advised to call back or seek an in-person evaluation if the symptoms worsen or if the condition fails to improve as anticipated.  I provided 20  minutes of non-face-to-face time during this encounter.   Pollyann Savoy, MD

## 2019-05-05 ENCOUNTER — Encounter: Payer: Self-pay | Admitting: Rheumatology

## 2019-05-05 ENCOUNTER — Telehealth (INDEPENDENT_AMBULATORY_CARE_PROVIDER_SITE_OTHER): Payer: Medicaid Other | Admitting: Rheumatology

## 2019-05-05 ENCOUNTER — Other Ambulatory Visit: Payer: Self-pay

## 2019-05-05 DIAGNOSIS — L409 Psoriasis, unspecified: Secondary | ICD-10-CM

## 2019-05-05 DIAGNOSIS — M79671 Pain in right foot: Secondary | ICD-10-CM

## 2019-05-05 DIAGNOSIS — F419 Anxiety disorder, unspecified: Secondary | ICD-10-CM

## 2019-05-05 DIAGNOSIS — G8929 Other chronic pain: Secondary | ICD-10-CM

## 2019-05-05 DIAGNOSIS — Z79899 Other long term (current) drug therapy: Secondary | ICD-10-CM

## 2019-05-05 DIAGNOSIS — M19041 Primary osteoarthritis, right hand: Secondary | ICD-10-CM

## 2019-05-05 DIAGNOSIS — B20 Human immunodeficiency virus [HIV] disease: Secondary | ICD-10-CM

## 2019-05-05 DIAGNOSIS — L4050 Arthropathic psoriasis, unspecified: Secondary | ICD-10-CM | POA: Diagnosis not present

## 2019-05-05 DIAGNOSIS — M19042 Primary osteoarthritis, left hand: Secondary | ICD-10-CM

## 2019-05-05 DIAGNOSIS — M79672 Pain in left foot: Secondary | ICD-10-CM

## 2019-05-05 DIAGNOSIS — M2242 Chondromalacia patellae, left knee: Secondary | ICD-10-CM

## 2019-05-05 DIAGNOSIS — M2241 Chondromalacia patellae, right knee: Secondary | ICD-10-CM

## 2019-05-05 DIAGNOSIS — F5105 Insomnia due to other mental disorder: Secondary | ICD-10-CM

## 2019-05-05 DIAGNOSIS — M545 Low back pain: Secondary | ICD-10-CM

## 2019-05-05 NOTE — Patient Instructions (Signed)

## 2019-05-14 MED FILL — TIVICAY 50 MG TABLET: 50 | 30 days supply | Qty: 30 | Fill #7

## 2019-05-14 MED FILL — DICLOFENAC SODIUM 1 % GEL: 1 | 12 days supply | Qty: 100 | Fill #1

## 2019-05-19 ENCOUNTER — Ambulatory Visit (HOSPITAL_COMMUNITY): Payer: Medicaid Other | Admitting: Psychiatry

## 2019-05-19 ENCOUNTER — Other Ambulatory Visit: Payer: Self-pay

## 2019-06-08 ENCOUNTER — Other Ambulatory Visit: Payer: Self-pay | Admitting: Rheumatology

## 2019-06-08 ENCOUNTER — Other Ambulatory Visit: Payer: Self-pay

## 2019-06-08 ENCOUNTER — Ambulatory Visit (INDEPENDENT_AMBULATORY_CARE_PROVIDER_SITE_OTHER): Payer: Medicaid Other | Admitting: Psychiatry

## 2019-06-08 ENCOUNTER — Other Ambulatory Visit: Payer: Self-pay | Admitting: Internal Medicine

## 2019-06-08 ENCOUNTER — Encounter (HOSPITAL_COMMUNITY): Payer: Self-pay | Admitting: Psychiatry

## 2019-06-08 VITALS — Wt 220.0 lb

## 2019-06-08 DIAGNOSIS — L4050 Arthropathic psoriasis, unspecified: Secondary | ICD-10-CM

## 2019-06-08 DIAGNOSIS — F39 Unspecified mood [affective] disorder: Secondary | ICD-10-CM | POA: Diagnosis not present

## 2019-06-08 DIAGNOSIS — F1011 Alcohol abuse, in remission: Secondary | ICD-10-CM

## 2019-06-08 DIAGNOSIS — F411 Generalized anxiety disorder: Secondary | ICD-10-CM

## 2019-06-08 DIAGNOSIS — R21 Rash and other nonspecific skin eruption: Secondary | ICD-10-CM

## 2019-06-08 DIAGNOSIS — L409 Psoriasis, unspecified: Secondary | ICD-10-CM

## 2019-06-08 MED ORDER — BUSPIRONE HCL 5 MG PO TABS: 5.0000 mg | ORAL_TABLET | Freq: Two times a day (BID) | ORAL | 1 refills | Status: DC

## 2019-06-08 MED FILL — CLOTRIMAZOLE 1% CREAM: 1 | 15 days supply | Qty: 30 | Fill #0
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
## 2019-06-09 MED FILL — TIVICAY 50 MG TABLET: 50 | 30 days supply | Qty: 30 | Fill #8

## 2019-06-10 MED ORDER — APREMILAST 30 MG PO TABS: 30.0000 mg | ORAL_TABLET | Freq: Two times a day (BID) | ORAL | 0 refills | Status: DC

## 2019-06-10 NOTE — Addendum Note (Signed)
Addended by: Earnestine Mealing on: 06/10/2019 08:48 AM   Modules accepted: Orders

## 2019-06-10 NOTE — Telephone Encounter (Addendum)
Last Visit: 05/05/2019 telemedicine  Next Visit: 08/02/2019  Per office note on 04/08/2019 dose will be 30mg  twice daily (after starter pack).  Spoke with patient and he has 1 week remaining of starter pack, advised patient maintenance dose will be sent to the pharmacy. He verbalized understanding.   Okay to refill per Dr. Estanislado Pandy.

## 2019-07-08 MED FILL — TIVICAY 50 MG TABLET: 50 | 30 days supply | Qty: 30 | Fill #9

## 2019-07-08 MED FILL — OTEZLA 30 MG TABS: 30 | 30 days supply | Qty: 60 | Fill #0

## 2019-07-15 ENCOUNTER — Other Ambulatory Visit: Payer: Self-pay

## 2019-07-15 ENCOUNTER — Ambulatory Visit (HOSPITAL_COMMUNITY): Payer: Medicaid Other | Admitting: Psychiatry

## 2019-07-28 NOTE — Progress Notes (Deleted)
Office Visit Note  Patient: Mitchell Meyers             Date of Birth: [birth year]           MRN: [id number]             PCP: Reather Converse, PA-C Referring: Reather Converse, PA-C Visit Date: 08/02/2019 Occupation: @GUAROCC @  Subjective:  No chief complaint on file.   History of Present Illness: Mitchell Meyers is a 49 y.o. male ***   Activities of Daily Living:  Patient reports morning stiffness for *** {minute/hour:19697}.   Patient {ACTIONS;DENIES/REPORTS:21021675::"Denies"} nocturnal pain.  Difficulty dressing/grooming: {ACTIONS;DENIES/REPORTS:21021675::"Denies"} Difficulty climbing stairs: {ACTIONS;DENIES/REPORTS:21021675::"Denies"} Difficulty getting out of chair: {ACTIONS;DENIES/REPORTS:21021675::"Denies"} Difficulty using hands for taps, buttons, cutlery, and/or writing: {ACTIONS;DENIES/REPORTS:21021675::"Denies"}  No Rheumatology ROS completed.   PMFS History:  Patient Active Problem List   Diagnosis Date Noted  . Osteoarthritis 03/15/2019  . Flu vaccine need 03/15/2019  . Human immunodeficiency virus (HIV) disease (HCC) 08/25/2018    Past Medical History:  Diagnosis Date  . HIV (human immunodeficiency virus infection) (HCC)     Family History  Problem Relation Age of Onset  . COPD Mother   . Cancer Father   . Arthritis Father   . Alcohol abuse Father   . Arthritis Sister   . Bipolar disorder Sister   . Hypertension Brother   . Hepatitis C Brother   . Healthy Daughter   . Healthy Daughter    Past Surgical History:  Procedure Laterality Date  . APPENDECTOMY  1999   Social History   Social History Narrative  . Not on file   Immunization History  Administered Date(s) Administered  . Influenza,inj,Quad PF,6+ Mos 03/15/2019  . Meningococcal Mcv4o 08/25/2018  . Pneumococcal Conjugate-13 08/25/2018     Objective: Vital Signs: There were no vitals taken for this visit.   Physical Exam   Musculoskeletal Exam: ***  CDAI Exam: CDAI Score:  -- Patient Global: --; Provider Global: -- Swollen: --; Tender: -- Joint Exam 08/02/2019   No joint exam has been documented for this visit   There is currently no information documented on the homunculus. Go to the Rheumatology activity and complete the homunculus joint exam.  Investigation: No additional findings.  Imaging: No results found.  Recent Labs: Lab Results  Component Value Date   WBC 8.9 04/08/2019   HGB 13.3 04/08/2019   PLT 364 04/08/2019   NA 138 04/08/2019   K 4.2 04/08/2019   CL 104 04/08/2019   CO2 26 04/08/2019   GLUCOSE 88 04/08/2019   BUN 17 04/08/2019   CREATININE 1.13 04/08/2019   BILITOT 0.3 04/08/2019   AST 13 04/08/2019   ALT 11 04/08/2019   PROT 8.1 04/08/2019   CALCIUM 9.5 04/08/2019   GFRAA 89 04/08/2019   QFTBGOLDPLUS NEGATIVE 08/03/2018    Speciality Comments: No specialty comments available.  Procedures:  No procedures performed Allergies: Patient has no known allergies.   Assessment / Plan:     Visit Diagnoses: No diagnosis found.  Orders: No orders of the defined types were placed in this encounter.  No orders of the defined types were placed in this encounter.   Face-to-face time spent with patient was *** minutes. Greater than 50% of time was spent in counseling and coordination of care.  Follow-Up Instructions: No follow-ups on file.   08/05/2018, CMA  Note - This record has been created using Ellen Henri.  Chart creation errors have been sought, but may not always  have  been located. Such creation errors do not reflect on  the standard of medical care.

## 2019-08-02 ENCOUNTER — Ambulatory Visit: Payer: Self-pay | Admitting: Rheumatology

## 2019-08-05 ENCOUNTER — Other Ambulatory Visit: Payer: Self-pay | Admitting: Rheumatology

## 2019-08-05 NOTE — Telephone Encounter (Signed)
Last Visit: 05/05/2019 telemedicine  Next Visit: 09/06/29  Okay to refill per Dr. Corliss Skains

## 2019-08-06 MED FILL — TIVICAY 50 MG TABLET: 50 | 30 days supply | Qty: 30 | Fill #10

## 2019-08-06 MED FILL — OTEZLA 30 MG TABS: 30 | 30 days supply | Qty: 60 | Fill #0

## 2019-08-14 ENCOUNTER — Ambulatory Visit
Admission: EM | Admit: 2019-08-14 | Discharge: 2019-08-14 | Disposition: A | Discharge status: Home / Self Care | Payer: Medicaid Other | Attending: Emergency Medicine | Admitting: Emergency Medicine

## 2019-08-14 DIAGNOSIS — G43009 Migraine without aura, not intractable, without status migrainosus: Secondary | ICD-10-CM | POA: Diagnosis not present

## 2019-08-14 MED ORDER — KETOROLAC TROMETHAMINE 60 MG/2ML IM SOLN
60.0000 mg | Freq: Once | INTRAMUSCULAR | Status: AC
  Administered 2019-08-14: 60 mg via INTRAMUSCULAR

## 2019-08-14 MED ORDER — DEXAMETHASONE SODIUM PHOSPHATE 10 MG/ML IJ SOLN
10.0000 mg | Freq: Once | INTRAMUSCULAR | Status: AC
  Administered 2019-08-14: 10 mg via INTRAMUSCULAR

## 2019-08-14 NOTE — ED Provider Notes (Addendum)
[place]   [id number] 08/14/19 Arrival Time: 1011  NI:[id number]  SUBJECTIVE:  Edgel Degnan is a 49 y.o. male who complains of migraine for x 5 days ago.  Denies a precipitating event, or recent head trauma.  Patient localizes his pain to the RT side of head.  Describes the pain as constant and sharp in character.  Patient has tried OTC tylenol without relief. Denies aggravating factors.  Reports similar symptoms in the past that improved with tylenol.  This is not the worst headache of their life.  Patient denies fever, chills, nausea, vomiting, vision changes, aura, rhinorrhea, watery eyes, chest pain, SOB, abdominal pain, weakness, numbness or tingling, slurred speech, facial droop.    Had COVID test yesterday.  Results pending  ROS: As per HPI.  All other pertinent ROS negative.     Past Medical History:  Diagnosis Date  . HIV (human immunodeficiency virus infection) ([place])    Past Surgical History:  Procedure Laterality Date  . APPENDECTOMY  1999   No Known Allergies No current facility-administered medications on file prior to encounter.   Current Outpatient Medications on File Prior to Encounter  Medication Sig Dispense Refill  . clotrimazole (LOTRIMIN) 1 % cream APPLY TOPICALLY 2 TIMES DAILY. 30 g 0  . diclofenac sodium (VOLTAREN) 1 % GEL APPLY 2 GRAMS TOPICALLY 4 TIMES DAILY. 400 g 1  . dolutegravir (TIVICAY) 50 MG tablet Take 1 tablet (50 mg total) by mouth daily. 30 tablet 11  . naproxen (NAPROSYN) 500 MG tablet Take 1 tablet (500 mg total) by mouth 2 (two) times daily with a meal. 30 tablet 2   Social History   Socioeconomic History  . Marital status: Married    Spouse name: Not on file  . Number of children: Not on file  . Years of education: Not on file  . Highest education level: Not on file  Occupational History  . Not on file  Tobacco Use  . Smoking status: Former Research scientist (life sciences)  . Smokeless tobacco: Never Used  Substance and Sexual Activity  .  Alcohol use: Not Currently  . Drug use: Not Currently  . Sexual activity: Not on file  Other Topics Concern  . Not on file  Social History Narrative  . Not on file   Social Determinants of Health   Financial Resource Strain:   . Difficulty of Paying Living Expenses: Not on file  Food Insecurity:   . Worried About Charity fundraiser in the Last Year: Not on file  . Ran Out of Food in the Last Year: Not on file  Transportation Needs:   . Lack of Transportation (Medical): Not on file  . Lack of Transportation (Non-Medical): Not on file  Physical Activity:   . Days of Exercise per Week: Not on file  . Minutes of Exercise per Session: Not on file  Stress:   . Feeling of Stress : Not on file  Social Connections:   . Frequency of Communication with Friends and Family: Not on file  . Frequency of Social Gatherings with Friends and Family: Not on file  . Attends Religious Services: Not on file  . Active Member of Clubs or Organizations: Not on file  . Attends Archivist Meetings: Not on file  . Marital Status: Not on file  Intimate Partner Violence:   . Fear of Current or Ex-Partner: Not on file  . Emotionally Abused: Not on file  . Physically Abused: Not on file  . Sexually Abused:  Not on file   Family History  Problem Relation Age of Onset  . COPD Mother   . Cancer Father   . Arthritis Father   . Alcohol abuse Father   . Arthritis Sister   . Bipolar disorder Sister   . Hypertension Brother   . Hepatitis C Brother   . Healthy Daughter   . Healthy Daughter     OBJECTIVE:  Vitals:   08/14/19 1027  BP: (!) 165/93  Pulse: 83  Resp: 18  Temp: 98 F (36.7 C)  SpO2: 98%    General appearance: alert; no distress Eyes: PERRLA; EOMI HENT: normocephalic; atraumatic; EACs clear, TMs pearly gray; nares patent without rhinorrhea; oropharynx clear Neck: supple with FROM Lungs: clear to auscultation bilaterally Heart: regular rate and rhythm.   Extremities: no  edema; symmetrical with no gross deformities Skin: warm and dry Neurologic: CN 2-12 grossly intact; finger to nose without difficulty; normal gait; strength and sensation intact bilaterally about the upper and lower extremities; negative pronator drift Psychological: alert and cooperative; normal mood and affect   ASSESSMENT & PLAN:  1. Migraine without aura and without status migrainosus, not intractable     Meds ordered this encounter  Medications  . ketorolac (TORADOL) injection 60 mg  . dexamethasone (DECADRON) injection 10 mg    Migraine cocktail given in office Rest and drink plenty of fluids Use OTC medications as needed for symptomatic relief Follow up with PCP if symptoms persists Return, call 911, or go to the ER if you have any new or worsening symptoms such as fever, chills, nausea, vomiting, chest pain, shortness of breath, cough, vision changes, worsening headache despite treatment, slurred speech, facial asymmetry, weakness in arms or legs, etc...  Reviewed expectations re: course of current medical issues. Questions answered. Outlined signs and symptoms indicating need for more acute intervention. Patient verbalized understanding. After Visit Summary given.   Rennis Harding, PA-C 08/14/19 1055    Alvino Chapel McCaulley, PA-C 08/14/19 1056

## 2019-08-14 NOTE — Discharge Instructions (Signed)
Migraine cocktail given in office Rest and drink plenty of fluids Use OTC medications as needed for symptomatic relief Follow up with PCP if symptoms persists Return, call 911, or go to the ER if you have any new or worsening symptoms such as fever, chills, nausea, vomiting, chest pain, shortness of breath, cough, vision changes, worsening headache despite treatment, slurred speech, facial asymmetry, weakness in arms or legs, etc..Marland Kitchen

## 2019-08-14 NOTE — ED Triage Notes (Signed)
Pt presents with c/o head ache for past 6 days, unrelieved with tylenol. Pt took covid test yesterday

## 2019-08-30 NOTE — Progress Notes (Deleted)
Office Visit Note  Patient: Mitchell Meyers             Date of Birth: 1971-01-04           MRN: [id number]             PCP: Reather Converse, PA-C Referring: Reather Converse, PA-C Visit Date: 09/07/2019 Occupation: @GUAROCC @  Subjective:  No chief complaint on file.   History of Present Illness: Mitchell Meyers is a 48 y.o. male ***   Activities of Daily Living:  Patient reports morning stiffness for *** {minute/hour:19697}.   Patient {ACTIONS;DENIES/REPORTS:21021675::"Denies"} nocturnal pain.  Difficulty dressing/grooming: {ACTIONS;DENIES/REPORTS:21021675::"Denies"} Difficulty climbing stairs: {ACTIONS;DENIES/REPORTS:21021675::"Denies"} Difficulty getting out of chair: {ACTIONS;DENIES/REPORTS:21021675::"Denies"} Difficulty using hands for taps, buttons, cutlery, and/or writing: {ACTIONS;DENIES/REPORTS:21021675::"Denies"}  No Rheumatology ROS completed.   PMFS History:  Patient Active Problem List   Diagnosis Date Noted  . Osteoarthritis 03/15/2019  . Flu vaccine need 03/15/2019  . Human immunodeficiency virus (HIV) disease (HCC) 08/25/2018    Past Medical History:  Diagnosis Date  . HIV (human immunodeficiency virus infection) (HCC)     Family History  Problem Relation Age of Onset  . COPD Mother   . Cancer Father   . Arthritis Father   . Alcohol abuse Father   . Arthritis Sister   . Bipolar disorder Sister   . Hypertension Brother   . Hepatitis C Brother   . Healthy Daughter   . Healthy Daughter    Past Surgical History:  Procedure Laterality Date  . APPENDECTOMY  1999   Social History   Social History Narrative  . Not on file   Immunization History  Administered Date(s) Administered  . Influenza,inj,Quad PF,6+ Mos 03/15/2019  . Meningococcal Mcv4o 08/25/2018  . Pneumococcal Conjugate-13 08/25/2018     Objective: Vital Signs: There were no vitals taken for this visit.   Physical Exam   Musculoskeletal Exam: ***  CDAI Exam: CDAI Score:  -- Patient Global: --; Provider Global: -- Swollen: --; Tender: -- Joint Exam 09/07/2019   No joint exam has been documented for this visit   There is currently no information documented on the homunculus. Go to the Rheumatology activity and complete the homunculus joint exam.  Investigation: No additional findings.  Imaging: No results found.  Recent Labs: Lab Results  Component Value Date   WBC 8.9 04/08/2019   HGB 13.3 04/08/2019   PLT 364 04/08/2019   NA 138 04/08/2019   K 4.2 04/08/2019   CL 104 04/08/2019   CO2 26 04/08/2019   GLUCOSE 88 04/08/2019   BUN 17 04/08/2019   CREATININE 1.13 04/08/2019   BILITOT 0.3 04/08/2019   AST 13 04/08/2019   ALT 11 04/08/2019   PROT 8.1 04/08/2019   CALCIUM 9.5 04/08/2019   GFRAA 89 04/08/2019   QFTBGOLDPLUS NEGATIVE 08/03/2018    Speciality Comments: No specialty comments available.  Procedures:  No procedures performed Allergies: Patient has no known allergies.   Assessment / Plan:     Visit Diagnoses: No diagnosis found.  Orders: No orders of the defined types were placed in this encounter.  No orders of the defined types were placed in this encounter.   Face-to-face time spent with patient was *** minutes. Greater than 50% of time was spent in counseling and coordination of care.  Follow-Up Instructions: No follow-ups on file.   08/05/2018, CMA  Note - This record has been created using Ellen Henri.  Chart creation errors have been sought, but may not always  have  been located. Such creation errors do not reflect on  the standard of medical care.

## 2019-09-03 ENCOUNTER — Other Ambulatory Visit: Payer: Self-pay | Admitting: Internal Medicine

## 2019-09-03 DIAGNOSIS — B20 Human immunodeficiency virus [HIV] disease: Secondary | ICD-10-CM

## 2019-09-06 NOTE — Progress Notes (Deleted)
Office Visit Note  Patient: Mitchell Meyers             Date of Birth: January 24, 1971           MRN: [id number]             PCP: Reather Converse, PA-C Referring: Reather Converse, PA-C Visit Date: 09/13/2019 Occupation: @GUAROCC @  Subjective:  No chief complaint on file.   History of Present Illness: [name] is a 49 y.o. male ***   Activities of Daily Living:  Patient reports morning stiffness for *** {minute/hour:19697}.   Patient {ACTIONS;DENIES/REPORTS:21021675::"Denies"} nocturnal pain.  Difficulty dressing/grooming: {ACTIONS;DENIES/REPORTS:21021675::"Denies"} Difficulty climbing stairs: {ACTIONS;DENIES/REPORTS:21021675::"Denies"} Difficulty getting out of chair: {ACTIONS;DENIES/REPORTS:21021675::"Denies"} Difficulty using hands for taps, buttons, cutlery, and/or writing: {ACTIONS;DENIES/REPORTS:21021675::"Denies"}  No Rheumatology ROS completed.   PMFS History:  Patient Active Problem List   Diagnosis Date Noted  . Osteoarthritis 03/15/2019  . Flu vaccine need 03/15/2019  . Human immunodeficiency virus (HIV) disease (HCC) 08/25/2018    Past Medical History:  Diagnosis Date  . HIV (human immunodeficiency virus infection) (HCC)     Family History  Problem Relation Age of Onset  . COPD Mother   . Cancer Father   . Arthritis Father   . Alcohol abuse Father   . Arthritis Sister   . Bipolar disorder Sister   . Hypertension Brother   . Hepatitis C Brother   . Healthy Daughter   . Healthy Daughter    Past Surgical History:  Procedure Laterality Date  . APPENDECTOMY  1999   Social History   Social History Narrative  . Not on file   Immunization History  Administered Date(s) Administered  . Influenza,inj,Quad PF,6+ Mos 03/15/2019  . Meningococcal Mcv4o 08/25/2018  . Pneumococcal Conjugate-13 08/25/2018     Objective: Vital Signs: There were no vitals taken for this visit.   Physical Exam   Musculoskeletal Exam: ***  CDAI Exam: CDAI Score:  -- Patient Global: --; Provider Global: -- Swollen: --; Tender: -- Joint Exam 09/13/2019   No joint exam has been documented for this visit   There is currently no information documented on the homunculus. Go to the Rheumatology activity and complete the homunculus joint exam.  Investigation: No additional findings.  Imaging: No results found.  Recent Labs: Lab Results  Component Value Date   WBC 8.9 04/08/2019   HGB 13.3 04/08/2019   PLT 364 04/08/2019   NA 138 04/08/2019   K 4.2 04/08/2019   CL 104 04/08/2019   CO2 26 04/08/2019   GLUCOSE 88 04/08/2019   BUN 17 04/08/2019   CREATININE 1.13 04/08/2019   BILITOT 0.3 04/08/2019   AST 13 04/08/2019   ALT 11 04/08/2019   PROT 8.1 04/08/2019   CALCIUM 9.5 04/08/2019   GFRAA 89 04/08/2019   QFTBGOLDPLUS NEGATIVE 08/03/2018    Speciality Comments: No specialty comments available.  Procedures:  No procedures performed Allergies: Patient has no known allergies.   Assessment / Plan:     Visit Diagnoses: No diagnosis found.  Orders: No orders of the defined types were placed in this encounter.  No orders of the defined types were placed in this encounter.   Face-to-face time spent with patient was *** minutes. Greater than 50% of time was spent in counseling and coordination of care.  Follow-Up Instructions: No follow-ups on file.   08/05/2018, PA-C  Note - This record has been created using Dragon software.  Chart creation errors have been sought, but may not always  have  been located. Such creation errors do not reflect on  the standard of medical care.

## 2019-09-07 ENCOUNTER — Ambulatory Visit: Payer: Self-pay | Admitting: Rheumatology

## 2019-09-08 MED FILL — TIVICAY 50 MG TABLET: 50 | 30 days supply | Qty: 30 | Fill #0

## 2019-09-13 ENCOUNTER — Ambulatory Visit: Payer: Self-pay | Admitting: Rheumatology

## 2019-09-24 ENCOUNTER — Ambulatory Visit
Admission: EM | Admit: 2019-09-24 | Discharge: 2019-09-24 | Disposition: A | Discharge status: Home / Self Care | Payer: Medicaid Other | Attending: Emergency Medicine | Admitting: Emergency Medicine

## 2019-09-24 ENCOUNTER — Other Ambulatory Visit: Payer: Self-pay

## 2019-09-24 DIAGNOSIS — M545 Low back pain, unspecified: Secondary | ICD-10-CM

## 2019-09-24 DIAGNOSIS — Z0289 Encounter for other administrative examinations: Secondary | ICD-10-CM

## 2019-09-24 MED ORDER — PREDNISONE 20 MG PO TABS: 20.0000 mg | ORAL_TABLET | Freq: Two times a day (BID) | ORAL | 0 refills | Status: AC

## 2019-09-24 MED ORDER — DEXAMETHASONE SODIUM PHOSPHATE 10 MG/ML IJ SOLN
10.0000 mg | Freq: Once | INTRAMUSCULAR | Status: AC
  Administered 2019-09-24: 10 mg via INTRAMUSCULAR

## 2019-09-24 MED ORDER — CYCLOBENZAPRINE HCL 10 MG PO TABS: 10.0000 mg | ORAL_TABLET | Freq: Every day | ORAL | 0 refills | Status: DC

## 2019-09-24 NOTE — ED Triage Notes (Signed)
Pt presents with complaints of lower back pain x 2 days. Reports it is worse with movement. History of the same.

## 2019-09-24 NOTE — Discharge Instructions (Signed)
Steroid shot given in office Continue conservative management of rest, ice, and gentle stretches Prednisone prescribed.  Take as directed and to completion Take cyclobenzaprine at nighttime for symptomatic relief. Avoid driving or operating heavy machinery while using medication. Follow up with PCP if symptoms persist Return or go to the ER if you have any new or worsening symptoms (fever, chills, chest pain, abdominal pain, changes in bowel or bladder habits, pain radiating into lower legs, etc...)  

## 2019-09-24 NOTE — ED Provider Notes (Signed)
Mitchell Meyers   [id number] 09/24/19 Arrival Time: 2202  CC: Back PAIN  SUBJECTIVE: History from: patient. Mitchell Meyers is a 49 y.o. male complains of back pain that began this morning.  States symptoms began after leaning forward to pick up a wrench.  Localizes the pain to the mid low back.  Describes the pain as constant and sharp in character.  Has tried naproxen and norco without relief.  Symptoms are made worse with leaning forward and walking.  Reports hx of spondylosis.  Denies fever, chills, erythema, ecchymosis, effusion, weakness, numbness and tingling, saddle paresthesias, loss of bowel or bladder function.      ROS: As per HPI.  All other pertinent ROS negative.     Past Medical History:  Diagnosis Date  . HIV (human immunodeficiency virus infection) ([place])    Past Surgical History:  Procedure Laterality Date  . APPENDECTOMY  1999   No Known Allergies No current facility-administered medications on file prior to encounter.   Current Outpatient Medications on File Prior to Encounter  Medication Sig Dispense Refill  . clotrimazole (LOTRIMIN) 1 % cream APPLY TOPICALLY 2 TIMES DAILY. 30 g 0  . diclofenac sodium (VOLTAREN) 1 % GEL APPLY 2 GRAMS TOPICALLY 4 TIMES DAILY. 400 g 1  . emtricitabine-tenofovir AF (DESCOVY) 200-25 MG tablet 200 tablets.    . naproxen (NAPROSYN) 500 MG tablet Take 1 tablet (500 mg total) by mouth 2 (two) times daily with a meal. 30 tablet 2  . TIVICAY 50 MG tablet TAKE 1 TABLET (50 MG TOTAL) BY MOUTH DAILY. 30 tablet 11   Social History   Socioeconomic History  . Marital status: Married    Spouse name: Not on file  . Number of children: Not on file  . Years of education: Not on file  . Highest education level: Not on file  Occupational History  . Not on file  Tobacco Use  . Smoking status: Former Research scientist (life sciences)  . Smokeless tobacco: Never Used  Substance and Sexual Activity  . Alcohol use: Not Currently  . Drug use: Not Currently  .  Sexual activity: Not on file  Other Topics Concern  . Not on file  Social History Narrative  . Not on file   Social Determinants of Health   Financial Resource Strain:   . Difficulty of Paying Living Expenses:   Food Insecurity:   . Worried About Charity fundraiser in the Last Year:   . Arboriculturist in the Last Year:   Transportation Needs:   . Film/video editor (Medical):   Marland Kitchen Lack of Transportation (Non-Medical):   Physical Activity:   . Days of Exercise per Week:   . Minutes of Exercise per Session:   Stress:   . Feeling of Stress :   Social Connections:   . Frequency of Communication with Friends and Family:   . Frequency of Social Gatherings with Friends and Family:   . Attends Religious Services:   . Active Member of Clubs or Organizations:   . Attends Archivist Meetings:   Marland Kitchen Marital Status:   Intimate Partner Violence:   . Fear of Current or Ex-Partner:   . Emotionally Abused:   Marland Kitchen Physically Abused:   . Sexually Abused:    Family History  Problem Relation Age of Onset  . COPD Mother   . Cancer Father   . Arthritis Father   . Alcohol abuse Father   . Arthritis Sister   . Bipolar disorder  Sister   . Hypertension Brother   . Hepatitis C Brother   . Healthy Daughter   . Healthy Daughter     OBJECTIVE:  Vitals:   09/24/19 1059  BP: (!) 142/90  Pulse: 71  Resp: 17  Temp: (!) 97.5 F (36.4 C)  TempSrc: Oral  SpO2: 98%    General appearance: ALERT; in no acute distress.  Head: NCAT Lungs: Normal respiratory effort; CTAB CV: RRR Musculoskeletal: Back Inspection: Skin warm, dry, clear and intact without obvious erythema, effusion, or ecchymosis.  Palpation: Nontender to palpation ROM: FROM active and passive Strength: 5/5 shld abduction, 5/5 shld adduction, 5/5 elbow flexion, 5/5 elbow extension, 5/5 grip strength, 5/5 hip flexion, 5/5 hip extension, 5/5 knee extension Skin: warm and dry Neurologic: Ambulates without difficulty;  Sensation intact about the upper/ lower extremities Psychological: alert and cooperative; normal mood and affect  ASSESSMENT & PLAN:  1. Acute low back pain, unspecified back pain laterality, unspecified whether sciatica present   2. Encounter to obtain excuse from work     Meds ordered this encounter  Medications  . predniSONE (DELTASONE) 20 MG tablet    Sig: Take 1 tablet (20 mg total) by mouth 2 (two) times daily with a meal for 5 days.    Dispense:  10 tablet    Refill:  0    Order Specific Question:   Supervising Provider    Answer:   Eustace Moore [[id number]]  . cyclobenzaprine (FLEXERIL) 10 MG tablet    Sig: Take 1 tablet (10 mg total) by mouth at bedtime.    Dispense:  15 tablet    Refill:  0    Order Specific Question:   Supervising Provider    Answer:   Eustace Moore [[id number]]  . dexamethasone (DECADRON) injection 10 mg   Steroid shot given in office Continue conservative management of rest, ice, and gentle stretches Prednisone prescribed.  Take as directed and to completion Take cyclobenzaprine at nighttime for symptomatic relief. Avoid driving or operating heavy machinery while using medication. Follow up with PCP if symptoms persist Return or go to the ER if you have any new or worsening symptoms (fever, chills, chest pain, abdominal pain, changes in bowel or bladder habits, pain radiating into lower legs, etc...)   Reviewed expectations re: course of current medical issues. Questions answered. Outlined signs and symptoms indicating need for more acute intervention. Patient verbalized understanding. After Visit Summary given.    Rennis Harding, PA-C 09/24/19 1106

## 2019-09-27 ENCOUNTER — Other Ambulatory Visit: Payer: Self-pay

## 2019-09-27 DIAGNOSIS — Z79899 Other long term (current) drug therapy: Secondary | ICD-10-CM

## 2019-09-27 DIAGNOSIS — B20 Human immunodeficiency virus [HIV] disease: Secondary | ICD-10-CM

## 2019-09-27 DIAGNOSIS — Z113 Encounter for screening for infections with a predominantly sexual mode of transmission: Secondary | ICD-10-CM

## 2019-09-28 ENCOUNTER — Other Ambulatory Visit: Payer: Medicaid Other

## 2019-10-01 MED FILL — TIVICAY 50 MG TABLET: 50 | 30 days supply | Qty: 30 | Fill #1

## 2019-10-27 ENCOUNTER — Other Ambulatory Visit: Payer: Medicaid Other

## 2019-10-28 MED FILL — TIVICAY 50 MG TABLET: 50 | 30 days supply | Qty: 30 | Fill #2

## 2019-11-16 ENCOUNTER — Other Ambulatory Visit: Payer: Self-pay | Admitting: Rheumatology

## 2019-11-16 DIAGNOSIS — M1991 Primary osteoarthritis, unspecified site: Secondary | ICD-10-CM

## 2019-11-22 MED FILL — TIVICAY 50 MG TABLET: 50 | 30 days supply | Qty: 30 | Fill #3

## 2019-11-24 ENCOUNTER — Ambulatory Visit: Payer: Medicaid Other | Admitting: Internal Medicine

## 2019-12-15 ENCOUNTER — Other Ambulatory Visit: Payer: Medicaid Other

## 2019-12-21 ENCOUNTER — Other Ambulatory Visit: Payer: Self-pay

## 2019-12-21 ENCOUNTER — Other Ambulatory Visit (HOSPITAL_COMMUNITY)
Admission: RE | Admit: 2019-12-21 | Discharge: 2019-12-21 | Disposition: A | Discharge status: Home / Self Care | Payer: Medicaid Other | Source: Ambulatory Visit | Attending: Internal Medicine | Admitting: Internal Medicine

## 2019-12-21 ENCOUNTER — Other Ambulatory Visit: Payer: Medicaid Other

## 2019-12-21 DIAGNOSIS — Z113 Encounter for screening for infections with a predominantly sexual mode of transmission: Secondary | ICD-10-CM | POA: Insufficient documentation

## 2019-12-21 DIAGNOSIS — Z79899 Other long term (current) drug therapy: Secondary | ICD-10-CM

## 2019-12-21 DIAGNOSIS — B20 Human immunodeficiency virus [HIV] disease: Secondary | ICD-10-CM

## 2019-12-21 DIAGNOSIS — Z125 Encounter for screening for malignant neoplasm of prostate: Secondary | ICD-10-CM

## 2019-12-22 ENCOUNTER — Other Ambulatory Visit: Payer: Medicaid Other

## 2019-12-22 LAB — T-HELPER CELL (CD4) - (RCID CLINIC ONLY)
CD4 % Helper T Cell: 42 % (ref 33–65)
CD4 T Cell Abs: 844 /uL (ref 400–1790)

## 2019-12-22 LAB — URINE CYTOLOGY ANCILLARY ONLY
Chlamydia: NEGATIVE
Comment: NEGATIVE
Comment: NORMAL
Neisseria Gonorrhea: NEGATIVE

## 2019-12-24 LAB — COMPREHENSIVE METABOLIC PANEL
AG Ratio: 1.1 (calc) (ref 1.0–2.5)
ALT: 16 U/L (ref 9–46)
AST: 15 U/L (ref 10–40)
Albumin: 4.2 g/dL (ref 3.6–5.1)
Alkaline phosphatase (APISO): 54 U/L (ref 36–130)
BUN: 15 mg/dL (ref 7–25)
CO2: 25 mmol/L (ref 20–32)
Calcium: 9.6 mg/dL (ref 8.6–10.3)
Chloride: 101 mmol/L (ref 98–110)
Creat: 1.14 mg/dL (ref 0.60–1.35)
Globulin: 3.8 g/dL (calc) — ABNORMAL HIGH (ref 1.9–3.7)
Glucose, Bld: 97 mg/dL (ref 65–99)
Potassium: 4.2 mmol/L (ref 3.5–5.3)
Sodium: 137 mmol/L (ref 135–146)
Total Bilirubin: 0.5 mg/dL (ref 0.2–1.2)
Total Protein: 8 g/dL (ref 6.1–8.1)

## 2019-12-24 LAB — CBC WITH DIFFERENTIAL/PLATELET
Absolute Monocytes: 492 cells/uL (ref 200–950)
Basophils Absolute: 33 cells/uL (ref 0–200)
Basophils Relative: 0.4 %
Eosinophils Absolute: 238 cells/uL (ref 15–500)
Eosinophils Relative: 2.9 %
HCT: 42 % (ref 38.5–50.0)
Hemoglobin: 13.9 g/dL (ref 13.2–17.1)
Lymphs Abs: 1902 cells/uL (ref 850–3900)
MCH: 29.4 pg (ref 27.0–33.0)
MCHC: 33.1 g/dL (ref 32.0–36.0)
MCV: 88.8 fL (ref 80.0–100.0)
MPV: 9.7 fL (ref 7.5–12.5)
Monocytes Relative: 6 %
Neutro Abs: 5535 cells/uL (ref 1500–7800)
Neutrophils Relative %: 67.5 %
Platelets: 313 10*3/uL (ref 140–400)
RBC: 4.73 10*6/uL (ref 4.20–5.80)
RDW: 12.7 % (ref 11.0–15.0)
Total Lymphocyte: 23.2 %
WBC: 8.2 10*3/uL (ref 3.8–10.8)

## 2019-12-24 LAB — LIPID PANEL
Cholesterol: 199 mg/dL (ref <200)
HDL: 35 mg/dL — ABNORMAL LOW (ref >=40)
LDL Cholesterol (Calc): 139 mg/dL (calc) — ABNORMAL HIGH
Non-HDL Cholesterol (Calc): 164 mg/dL (calc) — ABNORMAL HIGH (ref <130)
Total CHOL/HDL Ratio: 5.7 (calc) — ABNORMAL HIGH (ref <5.0)
Triglycerides: 124 mg/dL (ref <150)

## 2019-12-24 LAB — HIV-1 RNA QUANT-NO REFLEX-BLD
HIV 1 RNA Quant: <20 copies/mL
HIV-1 RNA Quant, Log: <1.3 Log copies/mL

## 2019-12-24 LAB — PSA: PSA: 0.6 ng/mL (ref <=4.0)

## 2019-12-24 LAB — RPR: RPR Ser Ql: NONREACTIVE

## 2019-12-26 ENCOUNTER — Ambulatory Visit
Admission: EM | Admit: 2019-12-26 | Discharge: 2019-12-26 | Disposition: A | Discharge status: Home / Self Care | Payer: BLUE CROSS/BLUE SHIELD | Attending: Emergency Medicine | Admitting: Emergency Medicine

## 2019-12-26 ENCOUNTER — Encounter: Payer: Self-pay | Admitting: Emergency Medicine

## 2019-12-26 ENCOUNTER — Other Ambulatory Visit: Payer: Self-pay

## 2019-12-26 DIAGNOSIS — J069 Acute upper respiratory infection, unspecified: Secondary | ICD-10-CM

## 2019-12-26 DIAGNOSIS — Z1152 Encounter for screening for COVID-19: Secondary | ICD-10-CM | POA: Diagnosis not present

## 2019-12-26 MED ORDER — MENTHOL 3 MG MT LOZG: 1.0000 | LOZENGE | OROMUCOSAL | 0 refills | Status: DC | PRN

## 2019-12-26 MED ORDER — BENZONATATE 100 MG PO CAPS: 100.0000 mg | ORAL_CAPSULE | Freq: Three times a day (TID) | ORAL | 0 refills | Status: DC

## 2019-12-26 MED ORDER — CETIRIZINE HCL 10 MG PO TABS: 10.0000 mg | ORAL_TABLET | Freq: Every day | ORAL | 0 refills | Status: DC

## 2019-12-26 NOTE — Discharge Instructions (Addendum)
COVID testing ordered.  It will take between 2-7 days for test results.  Someone will contact you regarding abnormal results.    In the meantime: You should remain isolated in your home for 10 days from symptom onset AND greater than 24 hours after symptoms resolution (absence of fever without the use of fever-reducing medication and improvement in respiratory symptoms), whichever is longer Get plenty of rest and push fluids Tessalon Perles prescribed for cough Zyrtec-D prescribed for nasal congestion, runny nose, and/or sore throat Cepacol lozenges prescribed for sore throat Use medications daily for symptom relief Use OTC medications like ibuprofen or tylenol as needed fever or pain Call or go to the ED if you have any new or worsening symptoms such as fever, worsening cough, shortness of breath, chest tightness, chest pain, turning blue, changes in mental status, etc..Marland Kitchen

## 2019-12-26 NOTE — ED Provider Notes (Signed)
[facility]   [id number] 12/26/19 Arrival Time: 1235  Chief Complaint  Patient presents with  . Cough     SUBJECTIVE: History from: patient.  Mitchell Meyers is a 49 y.o. male .who presents to the urgent care for complaint of cough nasal congestion and sore throat for the past 1 day.  Denies sick exposure to COVID, flu or strep.  Denies recent travel.  Denies aggravating or alleviating symptoms.  Denies previous COVID infection.   Denies fever, chills, fatigue, , rhinorrhea,  , SOB, wheezing, chest pain, nausea, vomiting, changes in bowel or bladder habits.     ROS: As per HPI.  All other pertinent ROS negative.      Past Medical History:  Diagnosis Date  . HIV (human immunodeficiency virus infection) (HCC)    Past Surgical History:  Procedure Laterality Date  . APPENDECTOMY  1999   No Known Allergies No current facility-administered medications on file prior to encounter.   Current Outpatient Medications on File Prior to Encounter  Medication Sig Dispense Refill  . clotrimazole (LOTRIMIN) 1 % cream APPLY TOPICALLY 2 TIMES DAILY. 30 g 0  . cyclobenzaprine (FLEXERIL) 10 MG tablet Take 1 tablet (10 mg total) by mouth at bedtime. 15 tablet 0  . diclofenac sodium (VOLTAREN) 1 % GEL APPLY 2 GRAMS TOPICALLY 4 TIMES DAILY. 400 g 1  . emtricitabine-tenofovir AF (DESCOVY) 200-25 MG tablet 200 tablets.    . naproxen (NAPROSYN) 500 MG tablet Take 1 tablet (500 mg total) by mouth 2 (two) times daily with a meal. 30 tablet 2  . TIVICAY 50 MG tablet TAKE 1 TABLET (50 MG TOTAL) BY MOUTH DAILY. 30 tablet 11   Social History   Socioeconomic History  . Marital status: Married    Spouse name: Not on file  . Number of children: Not on file  . Years of education: Not on file  . Highest education level: Not on file  Occupational History  . Not on file  Tobacco Use  . Smoking status: Former Games developer  . Smokeless tobacco: Never Used  Vaping Use  . Vaping Use: Never used    Substance and Sexual Activity  . Alcohol use: Not Currently  . Drug use: Not Currently  . Sexual activity: Not on file  Other Topics Concern  . Not on file  Social History Narrative  . Not on file   Social Determinants of Health   Financial Resource Strain:   . Difficulty of Paying Living Expenses:   Food Insecurity:   . Worried About Programme researcher, broadcasting/film/video in the Last Year:   . Barista in the Last Year:   Transportation Needs:   . Freight forwarder (Medical):   Marland Kitchen Lack of Transportation (Non-Medical):   Physical Activity:   . Days of Exercise per Week:   . Minutes of Exercise per Session:   Stress:   . Feeling of Stress :   Social Connections:   . Frequency of Communication with Friends and Family:   . Frequency of Social Gatherings with Friends and Family:   . Attends Religious Services:   . Active Member of Clubs or Organizations:   . Attends Banker Meetings:   Marland Kitchen Marital Status:   Intimate Partner Violence:   . Fear of Current or Ex-Partner:   . Emotionally Abused:   Marland Kitchen Physically Abused:   . Sexually Abused:    Family History  Problem Relation Age of Onset  . COPD Mother   .  Cancer Father   . Arthritis Father   . Alcohol abuse Father   . Arthritis Sister   . Bipolar disorder Sister   . Hypertension Brother   . Hepatitis C Brother   . Healthy Daughter   . Healthy Daughter     OBJECTIVE:  Vitals:   12/26/19 1316  BP: 133/84  Pulse: 90  Resp: 16  Temp: 98.2 F (36.8 C)  TempSrc: Oral  SpO2: 98%     General appearance: alert; smiling and laughing during encounter; nontoxic appearance HEENT: NCAT; Ears: EACs clear, TMs pearly gray; Eyes: PERRL.  EOM grossly intact. Nose: no rhinorrhea without nasal flaring; Throat: oropharynx clear, tolerating own secretions, tonsils not erythematous or enlarged, uvula midline Neck: supple without LAD; FROM Lungs: CTA bilaterally without adventitious breath sounds; normal respiratory effort, no  belly breathing or accessory muscle use; cough present Heart: regular rate and rhythm.  Radial pulses 2+ symmetrical bilaterally Abdomen: soft; normal active bowel sounds; nontender to palpation Skin: warm and dry; no obvious rashes Psychological: alert and cooperative; normal mood and affect appropriate for age   ASSESSMENT & PLAN:  1. Viral URI with cough   2. Encounter for screening for COVID-19     Meds ordered this encounter  Medications  . benzonatate (TESSALON) 100 MG capsule    Sig: Take 1 capsule (100 mg total) by mouth every 8 (eight) hours.    Dispense:  21 capsule    Refill:  0  . cetirizine (ZYRTEC ALLERGY) 10 MG tablet    Sig: Take 1 tablet (10 mg total) by mouth daily.    Dispense:  30 tablet    Refill:  0  . menthol-cetylpyridinium (CEPACOL) 3 MG lozenge    Sig: Take 1 lozenge (3 mg total) by mouth as needed for sore throat.    Dispense:  100 tablet    Refill:  0     Discharge Instructions    COVID testing ordered.  It will take between 2-7 days for test results.  Someone will contact you regarding abnormal results.    In the meantime: You should remain isolated in your home for 10 days from symptom onset AND greater than 24 hours after symptoms resolution (absence of fever without the use of fever-reducing medication and improvement in respiratory symptoms), whichever is longer Get plenty of rest and push fluids Tessalon Perles prescribed for cough Zyrtec-D prescribed for nasal congestion, runny nose, and/or sore throat Cepacol lozenges prescribed for sore throat Use medications daily for symptom relief Use OTC medications like ibuprofen or tylenol as needed fever or pain Call or go to the ED if you have any new or worsening symptoms such as fever, worsening cough, shortness of breath, chest tightness, chest pain, turning blue, changes in mental status, etc...  Reviewed expectations re: course of current medical issues. Questions answered. Outlined signs  and symptoms indicating need for more acute intervention. Patient verbalized understanding. After Visit Summary given.       Note: This document was prepared using Dragon voice recognition software and may include unintentional dictation errors.    Durward Parcel, FNP 12/26/19 1408

## 2019-12-26 NOTE — ED Triage Notes (Signed)
Cough and sinus congestion x 1 day

## 2019-12-27 LAB — NOVEL CORONAVIRUS, NAA: SARS-CoV-2, NAA: NOT DETECTED

## 2019-12-27 LAB — SARS-COV-2, NAA 2 DAY TAT

## 2019-12-27 MED FILL — TIVICAY 50 MG TABLET: 50 | 30 days supply | Qty: 30 | Fill #4

## 2020-01-05 ENCOUNTER — Encounter: Payer: BLUE CROSS/BLUE SHIELD | Admitting: Internal Medicine

## 2020-01-24 MED FILL — TIVICAY 50 MG TABLET: 50 | 30 days supply | Qty: 30 | Fill #5

## 2020-01-27 ENCOUNTER — Encounter (INDEPENDENT_AMBULATORY_CARE_PROVIDER_SITE_OTHER): Payer: Self-pay | Admitting: Gastroenterology

## 2020-02-17 MED FILL — TIVICAY 50 MG TABLET: 50 | 30 days supply | Qty: 30 | Fill #6

## 2020-02-28 ENCOUNTER — Other Ambulatory Visit (INDEPENDENT_AMBULATORY_CARE_PROVIDER_SITE_OTHER): Payer: Self-pay | Admitting: *Deleted

## 2020-02-28 ENCOUNTER — Telehealth (INDEPENDENT_AMBULATORY_CARE_PROVIDER_SITE_OTHER): Payer: Self-pay | Admitting: *Deleted

## 2020-02-28 ENCOUNTER — Other Ambulatory Visit: Payer: Self-pay

## 2020-02-28 ENCOUNTER — Encounter (INDEPENDENT_AMBULATORY_CARE_PROVIDER_SITE_OTHER): Payer: Self-pay | Admitting: *Deleted

## 2020-02-28 ENCOUNTER — Encounter (INDEPENDENT_AMBULATORY_CARE_PROVIDER_SITE_OTHER): Payer: Self-pay | Admitting: Gastroenterology

## 2020-02-28 ENCOUNTER — Ambulatory Visit (INDEPENDENT_AMBULATORY_CARE_PROVIDER_SITE_OTHER): Payer: Medicaid Other | Admitting: Gastroenterology

## 2020-02-28 DIAGNOSIS — Z1211 Encounter for screening for malignant neoplasm of colon: Secondary | ICD-10-CM

## 2020-02-28 DIAGNOSIS — Z7189 Other specified counseling: Secondary | ICD-10-CM | POA: Diagnosis not present

## 2020-02-28 DIAGNOSIS — R1013 Epigastric pain: Secondary | ICD-10-CM | POA: Diagnosis not present

## 2020-02-28 DIAGNOSIS — Z1212 Encounter for screening for malignant neoplasm of rectum: Secondary | ICD-10-CM | POA: Insufficient documentation

## 2020-02-28 MED ORDER — OMEPRAZOLE 40 MG PO CPDR: 40.0000 mg | DELAYED_RELEASE_CAPSULE | Freq: Every day | ORAL | 1 refills | Status: DC

## 2020-02-28 MED ORDER — SUTAB 1479-225-188 MG PO TABS: 1.0000 | ORAL_TABLET | Freq: Once | ORAL | 0 refills | Status: AC

## 2020-02-28 NOTE — Telephone Encounter (Signed)
Patient needs Sutab (copay card) ° °

## 2020-02-28 NOTE — Patient Instructions (Signed)
Schedule EGD and colonoscopy Start omeprazole 40 mg qday

## 2020-02-28 NOTE — Progress Notes (Signed)
Katrinka Blazing, M.D. Gastroenterology & Hepatology [facility] [address] [place], [place] [address] Primary Care Physician: Karna Dupes [address] [place] [address]  Referring MD: PCP  I will communicate my assessment and recommendations to the referring MD via EMR. Note: Occasional unusual wording and randomly placed punctuation marks may result from the use of speech recognition technology to transcribe this document"  Chief Complaint: Epigastric abdominal pain  History of Present Illness: Mitchell Meyers is a 49 y.o. male with past medical history of HIV, hypertension and previous diverticulitis, who presents for evaluation of epigastric abdominal discomfort.  Patient states that since 2017 he has presented intermittent episodes of burning epigastric discomfort intermittently throughout the years.  He states that he has the discomfort maybe once or twice a year which lasts around 4 to 5 days.  Occasionally he also feels some discomfort in the same area described as "stretching of his epigastric area" when he bends.  The patient states that he took Pepcid 2 months ago when he had 1 of these episodes without any relief.  He is not following any type of diet for the symptoms.  He also has noticed daily heartburn for the last couple weeks but no presence of dysphagia or odynophagia.The patient denies having any nausea, vomiting, fever, chills, hematochezia, melena, hematemesis, abdominal distention, diarrhea, jaundice, pruritus or weight loss.  Per clinical notes, the patient had positive IgG serology for H. pylori.  It is unclear if he was treated for this infection in the past.  Patient pulled the naproxen at least once a month for "osteoarthritis pain", he did not have any symptoms after taking his medication.  Last EGD: Never Last Colonoscopy: Never  FHx: neg for any gastrointestinal/liver disease, father  prostate cancer Social: neg smoking, alcohol or illicit drug use Surgical: Appendectomy  Past Medical History: Past Medical History:  Diagnosis Date  . HIV (human immunodeficiency virus infection) (HCC)   . Hypertension     Past Surgical History: Past Surgical History:  Procedure Laterality Date  . APPENDECTOMY  1999    Family History: Family History  Problem Relation Age of Onset  . COPD Mother   . Cancer Father   . Arthritis Father   . Alcohol abuse Father   . Arthritis Sister   . Bipolar disorder Sister   . Hypertension Brother   . Hepatitis C Brother   . Healthy Daughter   . Healthy Daughter     Social History: Social History   Tobacco Use  Smoking Status Former Smoker  Smokeless Tobacco Never Used   Social History   Substance and Sexual Activity  Alcohol Use Not Currently   Social History   Substance and Sexual Activity  Drug Use Not Currently    Allergies: No Known Allergies  Medications: Current Outpatient Medications  Medication Sig Dispense Refill  . clotrimazole (LOTRIMIN) 1 % cream APPLY TOPICALLY 2 TIMES DAILY. 30 g 0  . cyclobenzaprine (FLEXERIL) 10 MG tablet Take 1 tablet (10 mg total) by mouth at bedtime. 15 tablet 0  . diclofenac sodium (VOLTAREN) 1 % GEL APPLY 2 GRAMS TOPICALLY 4 TIMES DAILY. 400 g 1  . menthol-cetylpyridinium (CEPACOL) 3 MG lozenge Take 1 lozenge (3 mg total) by mouth as needed for sore throat. 100 tablet 0  . TIVICAY 50 MG tablet TAKE 1 TABLET (50 MG TOTAL) BY MOUTH DAILY. 30 tablet 11  . benzonatate (TESSALON) 100 MG capsule Take 1 capsule (  100 mg total) by mouth every 8 (eight) hours. 21 capsule 0  . cetirizine (ZYRTEC ALLERGY) 10 MG tablet Take 1 tablet (10 mg total) by mouth daily. 30 tablet 0  . emtricitabine-tenofovir AF (DESCOVY) 200-25 MG tablet 200 tablets.    . naproxen (NAPROSYN) 500 MG tablet Take 1 tablet (500 mg total) by mouth 2 (two) times daily with a meal. 30 tablet 2   No current  facility-administered medications for this visit.    Review of Systems: GENERAL: negative for malaise, night sweats HEENT: No changes in hearing or vision, no nose bleeds or other nasal problems. NECK: Negative for lumps, goiter, pain and significant neck swelling RESPIRATORY: Negative for cough, wheezing CARDIOVASCULAR: Negative for chest pain, leg swelling, palpitations, orthopnea GI: SEE HPI MUSCULOSKELETAL: Negative for joint pain or swelling, back pain, and muscle pain. SKIN: Negative for lesions, rash PSYCH: Negative for sleep disturbance, mood disorder and recent psychosocial stressors. HEMATOLOGY Negative for prolonged bleeding, bruising easily, and swollen nodes. ENDOCRINE: Negative for cold or heat intolerance, polyuria, polydipsia and goiter. NEURO: negative for tremor, gait imbalance, syncope and seizures. The remainder of the review of systems is noncontributory.   Physical Exam: BP 132/82 (BP Location: Left Arm, Patient Position: Sitting, Cuff Size: Normal)   Pulse 69   Temp (!) 96.9 F (36.1 C) (Temporal)   Ht 5\' 8"  (1.727 m)   Wt 228 lb 3.2 oz (103.5 kg)   BMI 34.70 kg/m  GENERAL: The patient is AO x3, in no acute distress. Obese. HEENT: Head is normocephalic and atraumatic. EOMI are intact. Mouth is well hydrated and without lesions. NECK: Supple. No masses LUNGS: Clear to auscultation. No presence of rhonchi/wheezing/rales. Adequate chest expansion HEART: RRR, normal s1 and s2. ABDOMEN: Soft, nontender, no guarding, no peritoneal signs, and nondistended. BS +. No masses. EXTREMITIES: Without any cyanosis, clubbing, rash, lesions or edema. NEUROLOGIC: AOx3, no focal motor deficit. SKIN: no jaundice, no rashes   Imaging/Labs: as above  I personally reviewed and interpreted the available labs, imaging and endoscopic files.  Impression and Plan: Mitchell Meyers is a 49 y.o. male with past medical history of HIV, hypertension and previous diverticulitis, who  presents for evaluation of epigastric abdominal discomfort.  The differential of the patient's symptoms is broad which includes peptic ulcer disease versus functional dyspepsia versus H. pylori infection versus irritable bowel syndrome, which will be further investigated with an EGD.  He will also be given PPI trial with omeprazole 40 mg every day.  He was counseled on the right way to take his medication.  On the other hand, the patient is due for colorectal cancer screening, for which a colonoscopy will be ordered today. Patient understood and agreed.  -Schedule EGD and colonoscopy -Start omeprazole 40 mg qday  All questions were answered.      53, MD Gastroenterology and Hepatology [facility]

## 2020-03-05 ENCOUNTER — Other Ambulatory Visit: Payer: Self-pay

## 2020-03-05 ENCOUNTER — Ambulatory Visit
Admission: EM | Admit: 2020-03-05 | Discharge: 2020-03-05 | Disposition: A | Discharge status: Home / Self Care | Payer: Medicaid Other | Attending: Emergency Medicine | Admitting: Emergency Medicine

## 2020-03-05 ENCOUNTER — Encounter: Payer: Self-pay | Admitting: Emergency Medicine

## 2020-03-05 DIAGNOSIS — G8929 Other chronic pain: Secondary | ICD-10-CM | POA: Diagnosis not present

## 2020-03-05 DIAGNOSIS — M25561 Pain in right knee: Secondary | ICD-10-CM | POA: Diagnosis not present

## 2020-03-05 DIAGNOSIS — M25562 Pain in left knee: Secondary | ICD-10-CM | POA: Diagnosis not present

## 2020-03-05 MED ORDER — DEXAMETHASONE SODIUM PHOSPHATE 10 MG/ML IJ SOLN
10.0000 mg | Freq: Once | INTRAMUSCULAR | Status: AC
  Administered 2020-03-05: 10 mg via INTRAMUSCULAR

## 2020-03-05 MED ORDER — MELOXICAM 7.5 MG PO TABS: 7.5000 mg | ORAL_TABLET | Freq: Every day | ORAL | 0 refills | Status: DC

## 2020-03-05 MED ORDER — DEXAMETHASONE SODIUM PHOSPHATE 10 MG/ML IJ SOLN: 10.0000 mg | Freq: Once | INTRAMUSCULAR | Status: DC

## 2020-03-05 NOTE — Discharge Instructions (Addendum)
Meloxicam was prescribed.  Take as directed Do not mix with other NSAID medication as it may increase the risk of GI bleed Follow-up with PCP Follow RICE instruction as attached Return or go to ED for worsening of symptoms

## 2020-03-05 NOTE — ED Triage Notes (Signed)
Bilateral knee swelling and pain x 4 days, denies any injury

## 2020-03-05 NOTE — ED Provider Notes (Signed)
[facility]   [id number] 03/05/20 Arrival Time: 1500  Chief Complaint  Patient presents with  . Joint Swelling     SUBJECTIVE: History from: patient.  Mitchell Meyers is a 49 y.o. male with history of arthritis  presented to the urgent care with a complaint of bilateral knee pain and swelling with right knee more pronounced for the past few days.  Denies any precipitating event.Marland Kitchen  He describes the pain as constant and achy.  He has tried OTC medications without relief.  His symptoms are made worse with ROM.  He denies similar symptoms in the past.  Denies chills, fever, nausea, vomiting, diarrhea.   ROS: As per HPI.  All other pertinent ROS negative.     Past Medical History:  Diagnosis Date  . HIV (human immunodeficiency virus infection) (HCC)   . Hypertension    Past Surgical History:  Procedure Laterality Date  . APPENDECTOMY  1999   No Known Allergies No current facility-administered medications on file prior to encounter.   Current Outpatient Medications on File Prior to Encounter  Medication Sig Dispense Refill  . clotrimazole (LOTRIMIN) 1 % cream APPLY TOPICALLY 2 TIMES DAILY. 30 g 0  . cyclobenzaprine (FLEXERIL) 10 MG tablet Take 1 tablet (10 mg total) by mouth at bedtime. 15 tablet 0  . diclofenac sodium (VOLTAREN) 1 % GEL APPLY 2 GRAMS TOPICALLY 4 TIMES DAILY. 400 g 1  . menthol-cetylpyridinium (CEPACOL) 3 MG lozenge Take 1 lozenge (3 mg total) by mouth as needed for sore throat. 100 tablet 0  . naproxen (NAPROSYN) 500 MG tablet Take 1 tablet (500 mg total) by mouth 2 (two) times daily with a meal. 30 tablet 2  . omeprazole (PRILOSEC) 40 MG capsule Take 1 capsule (40 mg total) by mouth daily. 90 capsule 1  . TIVICAY 50 MG tablet TAKE 1 TABLET (50 MG TOTAL) BY MOUTH DAILY. 30 tablet 11   Social History   Socioeconomic History  . Marital status: Married    Spouse name: Not on file  . Number of children: Not on file  . Years of education: Not on file    . Highest education level: Not on file  Occupational History  . Not on file  Tobacco Use  . Smoking status: Former Games developer  . Smokeless tobacco: Never Used  Vaping Use  . Vaping Use: Never used  Substance and Sexual Activity  . Alcohol use: Not Currently  . Drug use: Not Currently  . Sexual activity: Not on file  Other Topics Concern  . Not on file  Social History Narrative  . Not on file   Social Determinants of Health   Financial Resource Strain:   . Difficulty of Paying Living Expenses: Not on file  Food Insecurity:   . Worried About Programme researcher, broadcasting/film/video in the Last Year: Not on file  . Ran Out of Food in the Last Year: Not on file  Transportation Needs:   . Lack of Transportation (Medical): Not on file  . Lack of Transportation (Non-Medical): Not on file  Physical Activity:   . Days of Exercise per Week: Not on file  . Minutes of Exercise per Session: Not on file  Stress:   . Feeling of Stress : Not on file  Social Connections:   . Frequency of Communication with Friends and Family: Not on file  . Frequency of Social Gatherings with Friends and Family: Not on file  . Attends Religious Services: Not on file  . Active  Member of Clubs or Organizations: Not on file  . Attends Banker Meetings: Not on file  . Marital Status: Not on file  Intimate Partner Violence:   . Fear of Current or Ex-Partner: Not on file  . Emotionally Abused: Not on file  . Physically Abused: Not on file  . Sexually Abused: Not on file   Family History  Problem Relation Age of Onset  . COPD Mother   . Cancer Father   . Arthritis Father   . Alcohol abuse Father   . Arthritis Sister   . Bipolar disorder Sister   . Hypertension Brother   . Hepatitis C Brother   . Healthy Daughter   . Healthy Daughter     OBJECTIVE:  Vitals:   03/05/20 1612  BP: 120/87  Pulse: 77  Resp: 17  Temp: (!) 97 F (36.1 C)  TempSrc: Oral  SpO2: 96%  Weight: 228 lb (103.4 kg)  Height: 5\' 8"   (1.727 m)     Physical Exam Vitals and nursing note reviewed.  Constitutional:      General: He is not in acute distress.    Appearance: Normal appearance. He is normal weight. He is not ill-appearing, toxic-appearing or diaphoretic.  Cardiovascular:     Rate and Rhythm: Normal rate and regular rhythm.     Pulses: Normal pulses.     Heart sounds: Normal heart sounds. No murmur heard.  No friction rub. No gallop.   Pulmonary:     Effort: Pulmonary effort is normal. No respiratory distress.     Breath sounds: Normal breath sounds. No stridor. No wheezing, rhonchi or rales.  Chest:     Chest wall: No tenderness.  Musculoskeletal:        General: Tenderness present.     Right knee: Swelling present. Tenderness present.     Left knee: Swelling present. Tenderness present.     Comments: Patient is able to ambulate and bear weight with pain.  The right knee is with obvious deformity compared to the left knee.  No warmth, ecchymosis, open wound or lesion present.  Limited range of motion due to pain.  Neurovascular status intact.  Neurological:     Mental Status: He is alert and oriented to person, place, and time.     LABS:  No results found for this or any previous visit (from the past 24 hour(s)).   ASSESSMENT & PLAN:  1. Chronic pain of both knees     Meds ordered this encounter  Medications  . dexamethasone (DECADRON) injection 10 mg  . meloxicam (MOBIC) 7.5 MG tablet    Sig: Take 1 tablet (7.5 mg total) by mouth daily.    Dispense:  15 tablet    Refill:  0   Discharge instructions  Meloxicam was prescribed.  Take as directed Do not mix with other NSAID medication as it may increase the risk of GI bleed Follow-up with PCP Follow RICE instruction as attached Return or go to ED for worsening of symptoms   Reviewed expectations re: course of current medical issues. Questions answered. Outlined signs and symptoms indicating need for more acute intervention. Patient  verbalized understanding. After Visit Summary given.         , FNP 03/05/20 1635

## 2020-03-13 ENCOUNTER — Other Ambulatory Visit (HOSPITAL_COMMUNITY)
Admission: RE | Admit: 2020-03-13 | Discharge: 2020-03-13 | Disposition: A | Discharge status: Home / Self Care | Payer: Medicaid Other | Source: Ambulatory Visit | Attending: Gastroenterology | Admitting: Gastroenterology

## 2020-03-13 ENCOUNTER — Other Ambulatory Visit: Payer: Self-pay

## 2020-03-16 ENCOUNTER — Encounter (INDEPENDENT_AMBULATORY_CARE_PROVIDER_SITE_OTHER): Payer: Self-pay

## 2020-03-20 ENCOUNTER — Other Ambulatory Visit (HOSPITAL_COMMUNITY): Payer: Medicaid Other

## 2020-03-20 MED FILL — TIVICAY 50 MG TABLET: 50 | 30 days supply | Qty: 30 | Fill #7

## 2020-03-27 ENCOUNTER — Other Ambulatory Visit (HOSPITAL_COMMUNITY)
Admission: RE | Admit: 2020-03-27 | Discharge: 2020-03-27 | Disposition: A | Discharge status: Home / Self Care | Payer: Medicaid Other | Source: Ambulatory Visit | Attending: Gastroenterology | Admitting: Gastroenterology

## 2020-03-27 NOTE — Progress Notes (Signed)
Called patient, left a message with voicemail. I asked patient to call me back if he could come before 4:00 today. If not I explained to him he will need to call his Dr.'s office and have them reschedule his procedure. Waiting for pt. To call. Leaving encounter open. Melanie called pt.'s Dr.'s office to make them aware he didn't show for 2 different appointments for Covid screening. Nothing further needed.

## 2020-03-28 ENCOUNTER — Encounter (HOSPITAL_COMMUNITY): Admission: RE | Payer: Self-pay | Source: Ambulatory Visit

## 2020-03-28 SURGERY — COLONOSCOPY WITH PROPOFOL
Anesthesia: Monitor Anesthesia Care

## 2020-03-30 ENCOUNTER — Encounter: Payer: Self-pay | Admitting: Emergency Medicine

## 2020-03-30 ENCOUNTER — Ambulatory Visit
Admission: EM | Admit: 2020-03-30 | Discharge: 2020-03-30 | Disposition: A | Discharge status: Home / Self Care | Payer: Medicaid Other | Attending: Emergency Medicine | Admitting: Emergency Medicine

## 2020-03-30 ENCOUNTER — Other Ambulatory Visit: Payer: Self-pay

## 2020-03-30 DIAGNOSIS — J029 Acute pharyngitis, unspecified: Secondary | ICD-10-CM

## 2020-03-30 DIAGNOSIS — Z20822 Contact with and (suspected) exposure to covid-19: Secondary | ICD-10-CM

## 2020-03-30 MED ORDER — LIDOCAINE VISCOUS HCL 2 % MT SOLN: 15.0000 mL | OROMUCOSAL | 0 refills | Status: DC | PRN

## 2020-03-30 MED ORDER — LIDOCAINE VISCOUS HCL 2 % MT SOLN: 15.0000 mL | OROMUCOSAL | 0 refills | Status: AC | PRN

## 2020-03-30 NOTE — ED Provider Notes (Signed)
[facility]   [id number] 03/30/20 Arrival Time: 1446   CC: Sore throat  SUBJECTIVE: History from: patient.  Mitchell Meyers is a 49 y.o. male who presents with sore throat and sensation of something stuck in his throat x 1-2 days.  Son with cold symptoms.  Denies eating anything unusual that may have gotten caught in his throat.  Denies alleviating factors.  Denies aggravating factors.  Denies previous symptoms in the past.   Denies fever, chills, fatigue, sinus pain, rhinorrhea, dysphagia, dyspnea, SOB, wheezing, chest pain, nausea, changes in bowel or bladder habits.    ROS: As per HPI.  All other pertinent ROS negative.     Past Medical History:  Diagnosis Date  . HIV (human immunodeficiency virus infection) (HCC)   . Hypertension    Past Surgical History:  Procedure Laterality Date  . APPENDECTOMY  1999   No Known Allergies No current facility-administered medications on file prior to encounter.   Current Outpatient Medications on File Prior to Encounter  Medication Sig Dispense Refill  . clotrimazole (LOTRIMIN) 1 % cream APPLY TOPICALLY 2 TIMES DAILY. (Patient taking differently: Apply 1 application topically daily as needed (infection on ear). ) 30 g 0  . cyclobenzaprine (FLEXERIL) 10 MG tablet Take 1 tablet (10 mg total) by mouth at bedtime. (Patient taking differently: Take 10 mg by mouth daily as needed for muscle spasms. ) 15 tablet 0  . diclofenac sodium (VOLTAREN) 1 % GEL APPLY 2 GRAMS TOPICALLY 4 TIMES DAILY. (Patient taking differently: Apply 2 g topically daily as needed (arthritis). ) 400 g 1  . LORazepam (ATIVAN) 2 MG tablet Take 2 mg by mouth daily as needed for anxiety.    . meloxicam (MOBIC) 7.5 MG tablet Take 1 tablet (7.5 mg total) by mouth daily. 15 tablet 0  . naproxen (NAPROSYN) 500 MG tablet Take 1 tablet (500 mg total) by mouth 2 (two) times daily with a meal. (Patient taking differently: Take 500-1,000 mg by mouth daily as needed for mild  pain. ) 30 tablet 2  . omeprazole (PRILOSEC) 40 MG capsule Take 1 capsule (40 mg total) by mouth daily. 90 capsule 1  . SUTAB [phone] MG TABS Take 1 tablet by mouth daily.    Marland Kitchen TIVICAY 50 MG tablet TAKE 1 TABLET (50 MG TOTAL) BY MOUTH DAILY. (Patient taking differently: Take 50 mg by mouth daily. ) 30 tablet 11   Social History   Socioeconomic History  . Marital status: Married    Spouse name: Not on file  . Number of children: Not on file  . Years of education: Not on file  . Highest education level: Not on file  Occupational History  . Not on file  Tobacco Use  . Smoking status: Former Games developer  . Smokeless tobacco: Never Used  Vaping Use  . Vaping Use: Never used  Substance and Sexual Activity  . Alcohol use: Not Currently  . Drug use: Not Currently  . Sexual activity: Not on file  Other Topics Concern  . Not on file  Social History Narrative  . Not on file   Social Determinants of Health   Financial Resource Strain:   . Difficulty of Paying Living Expenses: Not on file  Food Insecurity:   . Worried About Programme researcher, broadcasting/film/video in the Last Year: Not on file  . Ran Out of Food in the Last Year: Not on file  Transportation Needs:   . Lack of Transportation (Medical): Not on file  .  Lack of Transportation (Non-Medical): Not on file  Physical Activity:   . Days of Exercise per Week: Not on file  . Minutes of Exercise per Session: Not on file  Stress:   . Feeling of Stress : Not on file  Social Connections:   . Frequency of Communication with Friends and Family: Not on file  . Frequency of Social Gatherings with Friends and Family: Not on file  . Attends Religious Services: Not on file  . Active Member of Clubs or Organizations: Not on file  . Attends Banker Meetings: Not on file  . Marital Status: Not on file  Intimate Partner Violence:   . Fear of Current or Ex-Partner: Not on file  . Emotionally Abused: Not on file  . Physically Abused: Not on file   . Sexually Abused: Not on file   Family History  Problem Relation Age of Onset  . COPD Mother   . Cancer Father   . Arthritis Father   . Alcohol abuse Father   . Arthritis Sister   . Bipolar disorder Sister   . Hypertension Brother   . Hepatitis C Brother   . Healthy Daughter   . Healthy Daughter     OBJECTIVE:  Vitals:   03/30/20 1458 03/30/20 1500  BP:  118/72  Pulse:  86  Resp:  18  Temp:  97.6 F (36.4 C)  TempSrc:  Oral  SpO2:  96%  Weight: 220 lb (99.8 kg)   Height: 5\' 8"  (1.727 m)      General appearance: alert; well-appearing, nontoxic; speaking in full sentences and tolerating own secretions HEENT: NCAT; Ears: EACs clear, TMs pearly gray; Eyes: PERRL.  EOM grossly intact.Nose: nares patent without rhinorrhea, Throat: oropharynx clear, tonsils non erythematous or enlarged, uvula midline  Neck: supple without LAD Lungs: unlabored respirations, symmetrical air entry; cough: absent; no respiratory distress; CTAB Heart: regular rate and rhythm.  Skin: warm and dry Psychological: alert and cooperative; normal mood and affect    ASSESSMENT & PLAN:  1. Sore throat   2. Encounter for laboratory testing for COVID-19 virus     Meds ordered this encounter  Medications  . lidocaine (XYLOCAINE) 2 % solution    Sig: Use as directed 15 mLs in the mouth or throat as needed for mouth pain (Do NOT exceed 8 doses in a 24 hour period).    Dispense:  100 mL    Refill:  0    Order Specific Question:   Supervising Provider    Answer:   Eustace Moore   Declines soft tissue neck x-ray COVID testing ordered.  It will take between 5-7 days for test results.  Someone will contact you regarding abnormal results.    In the meantime: You should remain isolated in your home for 10 days from symptom onset AND greater than 72 hours after symptoms resolution (absence of fever without the use of fever-reducing medication and improvement in respiratory symptoms),  whichever is longer Get plenty of rest and push fluids Viscous lidocaine prescribed.  This is an oral solution you can swish, gargle, and/or swallow as needed for symptomatic relief of sore throat.  Do not exceed 8 doses in a 24 hour period.  Do not use prior to eating, as this will numb your entire mouth.    Use OTC zyrtec for nasal congestion, runny nose, and/or sore throat Use OTC flonase for nasal congestion and runny nose Use medications daily for symptom relief Use OTC medications  like ibuprofen or tylenol as needed fever or pain Call or go to the ED if you have any new or worsening symptoms such as fever, cough, shortness of breath, chest tightness, chest pain, turning blue, changes in mental status, etc...   Reviewed expectations re: course of current medical issues. Questions answered. Outlined signs and symptoms indicating need for more acute intervention. Patient verbalized understanding. After Visit Summary given.         Rennis Harding, PA-C 03/30/20 1518

## 2020-03-30 NOTE — ED Triage Notes (Signed)
Scratchy throat and feels like something is in his throat, denies any trouble swallowing.

## 2020-03-30 NOTE — Discharge Instructions (Signed)
Declines soft tissue neck x-ray COVID testing ordered.  It will take between 5-7 days for test results.  Someone will contact you regarding abnormal results.    In the meantime: You should remain isolated in your home for 10 days from symptom onset AND greater than 72 hours after symptoms resolution (absence of fever without the use of fever-reducing medication and improvement in respiratory symptoms), whichever is longer Get plenty of rest and push fluids Viscous lidocaine prescribed.  This is an oral solution you can swish, gargle, and/or swallow as needed for symptomatic relief of sore throat.  Do not exceed 8 doses in a 24 hour period.  Do not use prior to eating, as this will numb your entire mouth.    Use OTC zyrtec for nasal congestion, runny nose, and/or sore throat Use OTC flonase for nasal congestion and runny nose Use medications daily for symptom relief Use OTC medications like ibuprofen or tylenol as needed fever or pain Call or go to the ED if you have any new or worsening symptoms such as fever, cough, shortness of breath, chest tightness, chest pain, turning blue, changes in mental status, etc..Marland Kitchen

## 2020-03-31 LAB — NOVEL CORONAVIRUS, NAA: SARS-CoV-2, NAA: NOT DETECTED

## 2020-03-31 LAB — SARS-COV-2, NAA 2 DAY TAT

## 2020-04-03 ENCOUNTER — Ambulatory Visit (HOSPITAL_COMMUNITY): Admission: RE | Admit: 2020-04-03 | Payer: Medicaid Other | Source: Ambulatory Visit | Admitting: Gastroenterology

## 2020-04-18 MED FILL — TIVICAY 50 MG TABLET: 50 | 30 days supply | Qty: 30 | Fill #8

## 2020-05-01 ENCOUNTER — Other Ambulatory Visit (INDEPENDENT_AMBULATORY_CARE_PROVIDER_SITE_OTHER): Payer: Self-pay

## 2020-05-09 ENCOUNTER — Telehealth: Payer: Self-pay

## 2020-05-09 NOTE — Telephone Encounter (Signed)
Patient contacted office to schedule labs and follow up appointment. Would like to add PSA to lab draw. Will forward message to MD to advise if okay to draw this lab. Will need orders entered prior to lab appointment.

## 2020-05-10 NOTE — Telephone Encounter (Signed)
Ok to add

## 2020-05-12 ENCOUNTER — Other Ambulatory Visit: Payer: Medicaid Other

## 2020-05-12 ENCOUNTER — Other Ambulatory Visit: Payer: Self-pay

## 2020-05-12 DIAGNOSIS — Z113 Encounter for screening for infections with a predominantly sexual mode of transmission: Secondary | ICD-10-CM

## 2020-05-12 DIAGNOSIS — B20 Human immunodeficiency virus [HIV] disease: Secondary | ICD-10-CM

## 2020-05-12 DIAGNOSIS — Z79899 Other long term (current) drug therapy: Secondary | ICD-10-CM

## 2020-05-12 LAB — T-HELPER CELL (CD4) - (RCID CLINIC ONLY)
CD4 % Helper T Cell: 46 % (ref 33–65)
CD4 T Cell Abs: 888 /uL (ref 400–1790)

## 2020-05-16 ENCOUNTER — Encounter (INDEPENDENT_AMBULATORY_CARE_PROVIDER_SITE_OTHER): Payer: Self-pay

## 2020-05-16 LAB — LIPID PANEL
Cholesterol: 220 mg/dL — ABNORMAL HIGH (ref <200)
HDL: 34 mg/dL — ABNORMAL LOW (ref >=40)
LDL Cholesterol (Calc): 161 mg/dL (calc) — ABNORMAL HIGH
Non-HDL Cholesterol (Calc): 186 mg/dL (calc) — ABNORMAL HIGH (ref <130)
Total CHOL/HDL Ratio: 6.5 (calc) — ABNORMAL HIGH (ref <5.0)
Triglycerides: 123 mg/dL (ref <150)

## 2020-05-16 LAB — CBC WITH DIFFERENTIAL/PLATELET
Absolute Monocytes: 600 cells/uL (ref 200–950)
Basophils Absolute: 32 cells/uL (ref 0–200)
Basophils Relative: 0.4 %
Eosinophils Absolute: 272 cells/uL (ref 15–500)
Eosinophils Relative: 3.4 %
HCT: 42.7 % (ref 38.5–50.0)
Hemoglobin: 14.4 g/dL (ref 13.2–17.1)
Lymphs Abs: 1912 cells/uL (ref 850–3900)
MCH: 30.3 pg (ref 27.0–33.0)
MCHC: 33.7 g/dL (ref 32.0–36.0)
MCV: 89.7 fL (ref 80.0–100.0)
MPV: 9.9 fL (ref 7.5–12.5)
Monocytes Relative: 7.5 %
Neutro Abs: 5184 cells/uL (ref 1500–7800)
Neutrophils Relative %: 64.8 %
Platelets: 318 10*3/uL (ref 140–400)
RBC: 4.76 10*6/uL (ref 4.20–5.80)
RDW: 13.3 % (ref 11.0–15.0)
Total Lymphocyte: 23.9 %
WBC: 8 10*3/uL (ref 3.8–10.8)

## 2020-05-16 LAB — COMPREHENSIVE METABOLIC PANEL
AG Ratio: 1 (calc) (ref 1.0–2.5)
ALT: 15 U/L (ref 9–46)
AST: 15 U/L (ref 10–35)
Albumin: 3.9 g/dL (ref 3.6–5.1)
Alkaline phosphatase (APISO): 57 U/L (ref 35–144)
BUN: 14 mg/dL (ref 7–25)
CO2: 28 mmol/L (ref 20–32)
Calcium: 9.4 mg/dL (ref 8.6–10.3)
Chloride: 102 mmol/L (ref 98–110)
Creat: 0.98 mg/dL (ref 0.70–1.33)
Globulin: 3.8 g/dL (calc) — ABNORMAL HIGH (ref 1.9–3.7)
Glucose, Bld: 98 mg/dL (ref 65–99)
Potassium: 4.2 mmol/L (ref 3.5–5.3)
Sodium: 139 mmol/L (ref 135–146)
Total Bilirubin: 0.3 mg/dL (ref 0.2–1.2)
Total Protein: 7.7 g/dL (ref 6.1–8.1)

## 2020-05-16 LAB — HIV-1 RNA QUANT-NO REFLEX-BLD
HIV 1 RNA Quant: <20 Copies/mL
HIV-1 RNA Quant, Log: <1.3 Log cps/mL

## 2020-05-16 MED FILL — TIVICAY 50 MG TABLET: 50 | 30 days supply | Qty: 30 | Fill #9

## 2020-05-17 ENCOUNTER — Telehealth (INDEPENDENT_AMBULATORY_CARE_PROVIDER_SITE_OTHER): Payer: Self-pay

## 2020-05-17 DIAGNOSIS — Z1211 Encounter for screening for malignant neoplasm of colon: Secondary | ICD-10-CM

## 2020-05-17 MED ORDER — SUTAB 1479-225-188 MG PO TABS: 376.0000 mg | ORAL_TABLET | Freq: Once | ORAL | 0 refills | Status: AC

## 2020-05-17 NOTE — Telephone Encounter (Signed)
Mitchell Meyers, CMA  

## 2020-05-23 ENCOUNTER — Other Ambulatory Visit (HOSPITAL_COMMUNITY)
Admission: RE | Admit: 2020-05-23 | Discharge: 2020-05-23 | Disposition: A | Discharge status: Home / Self Care | Payer: Medicaid Other | Source: Ambulatory Visit | Attending: Gastroenterology | Admitting: Gastroenterology

## 2020-05-23 NOTE — OR Nursing (Signed)
Called patient to verify instructions for his procedures tomorrow. Patient stated he needed to cancel because he was "down in his back." Candelaria Stagers Lovelace notified.

## 2020-05-23 NOTE — Progress Notes (Signed)
Called patient left message for him to call me, if he can come this afternoon. If not I asked him to call his dr.'s office and rescheduled his procedure.  I'll wait to hear from patient. If I don't I'll call him after lunch I called patient back and left another message. I also left a message with his wife and asked her to call him since he would recognize her # and answer. Patient must have called his provider and canceled. Because he called here and said he had the wrong #. The that fact his Covid appt. Is no longer on the schedule. I don't think the cancellation had time to show up on the schedule when I looked. Nothing further needed.

## 2020-05-24 ENCOUNTER — Ambulatory Visit (HOSPITAL_COMMUNITY): Admission: RE | Admit: 2020-05-24 | Payer: Medicaid Other | Source: Ambulatory Visit | Admitting: Gastroenterology

## 2020-05-24 ENCOUNTER — Encounter (HOSPITAL_COMMUNITY): Admission: RE | Payer: Self-pay | Source: Ambulatory Visit

## 2020-05-24 SURGERY — ESOPHAGOGASTRODUODENOSCOPY (EGD) WITH PROPOFOL
Anesthesia: Monitor Anesthesia Care

## 2020-06-20 ENCOUNTER — Ambulatory Visit: Payer: Self-pay | Admitting: Rheumatology

## 2020-06-20 NOTE — Progress Notes (Deleted)
Office Visit Note  Patient: Mitchell Meyers             Date of Birth: [birth year]           MRN: [id number]             PCP: Suzan Slick, MD Referring: Reather Converse, PA-C Visit Date: 06/20/2020 Occupation: @GUAROCC @  Subjective:  No chief complaint on file.   History of Present Illness: Mitchell Meyers is a 50 y.o. male ***   Activities of Daily Living:  Patient reports morning stiffness for *** {minute/hour:19697}.   Patient {ACTIONS;DENIES/REPORTS:21021675::"Denies"} nocturnal pain.  Difficulty dressing/grooming: {ACTIONS;DENIES/REPORTS:21021675::"Denies"} Difficulty climbing stairs: {ACTIONS;DENIES/REPORTS:21021675::"Denies"} Difficulty getting out of chair: {ACTIONS;DENIES/REPORTS:21021675::"Denies"} Difficulty using hands for taps, buttons, cutlery, and/or writing: {ACTIONS;DENIES/REPORTS:21021675::"Denies"}  No Rheumatology ROS completed.   PMFS History:  Patient Active Problem List   Diagnosis Date Noted  . Epigastric burning sensation 02/28/2020  . Screening for colorectal cancer 02/28/2020  . Osteoarthritis 03/15/2019  . Flu vaccine need 03/15/2019  . Human immunodeficiency virus (HIV) disease (HCC) 08/25/2018    Past Medical History:  Diagnosis Date  . HIV (human immunodeficiency virus infection) (HCC)   . Hypertension     Family History  Problem Relation Age of Onset  . COPD Mother   . Cancer Father   . Arthritis Father   . Alcohol abuse Father   . Arthritis Sister   . Bipolar disorder Sister   . Hypertension Brother   . Hepatitis C Brother   . Healthy Daughter   . Healthy Daughter    Past Surgical History:  Procedure Laterality Date  . APPENDECTOMY  1999   Social History   Social History Narrative  . Not on file   Immunization History  Administered Date(s) Administered  . Influenza,inj,Quad PF,6+ Mos 03/15/2019  . Meningococcal Mcv4o 08/25/2018  . Pneumococcal Conjugate-13 08/25/2018     Objective: Vital Signs: There were  no vitals taken for this visit.   Physical Exam   Musculoskeletal Exam: ***  CDAI Exam: CDAI Score: -- Patient Global: --; Provider Global: -- Swollen: --; Tender: -- Joint Exam 06/20/2020   No joint exam has been documented for this visit   There is currently no information documented on the homunculus. Go to the Rheumatology activity and complete the homunculus joint exam.  Investigation: No additional findings.  Imaging: No results found.  Recent Labs: Lab Results  Component Value Date   WBC 8.0 05/12/2020   HGB 14.4 05/12/2020   PLT 318 05/12/2020   NA 139 05/12/2020   K 4.2 05/12/2020   CL 102 05/12/2020   CO2 28 05/12/2020   GLUCOSE 98 05/12/2020   BUN 14 05/12/2020   CREATININE 0.98 05/12/2020   BILITOT 0.3 05/12/2020   AST 15 05/12/2020   ALT 15 05/12/2020   PROT 7.7 05/12/2020   CALCIUM 9.4 05/12/2020   GFRAA 89 04/08/2019   QFTBGOLDPLUS NEGATIVE 08/03/2018    Speciality Comments: No specialty comments available.  Procedures:  No procedures performed Allergies: Patient has no known allergies.   Assessment / Plan:     Visit Diagnoses: No diagnosis found.  Orders: No orders of the defined types were placed in this encounter.  No orders of the defined types were placed in this encounter.   Face-to-face time spent with patient was *** minutes. Greater than 50% of time was spent in counseling and coordination of care.  Follow-Up Instructions: No follow-ups on file.   08/05/2018, CMA  Note - This record has  been created using [employer].  Chart creation errors have been sought, but may not always  have been located. Such creation errors do not reflect on  the standard of medical care.

## 2020-06-20 NOTE — Progress Notes (Signed)
Office Visit Note  Patient: Mitchell Meyers             Date of Birth: 06/19/1970           MRN: [id number]             PCP: Suzan Slick, MD Referring: Reather Converse, PA-C Visit Date: 06/22/2020 Occupation: @GUAROCC @  Subjective:  Increased pain.   History of Present Illness: Tarrance Januszewski is a 50 y.o. male returns today after his last office visit on April 08, 2019.  At that time he did not have any synovitis on his examination.  We discussed possible use of Otezla at that visit.  Psoriasis was not very active at that visit.  He was having discomfort from osteoarthritis in his hands and feet.  X-rays of the knee joint showed mild osteoarthritis.  X-ray of the lumbar spine was unremarkable.  He states recently has been experiencing increased lower back pain.  Which she describes over the SI joints and lower back region.  He states after stopping consumption of chocolate his joint symptoms improved a lot.  Activities of Daily Living:  Patient reports morning stiffness for 1-10 minutes.   Patient Denies nocturnal pain.  Difficulty dressing/grooming: Reports Difficulty climbing stairs: Reports Difficulty getting out of chair: Reports Difficulty using hands for taps, buttons, cutlery, and/or writing: Denies  Review of Systems  Constitutional: Positive for fatigue.  HENT: Negative for mouth sores, mouth dryness and nose dryness.   Eyes: Negative for pain, itching, visual disturbance and dryness.  Respiratory: Negative for cough, hemoptysis, shortness of breath and difficulty breathing.   Cardiovascular: Negative for chest pain, palpitations and swelling in legs/feet.  Gastrointestinal: Positive for abdominal pain. Negative for blood in stool, constipation and diarrhea.  Endocrine: Negative for increased urination.  Genitourinary: Negative for painful urination.  Musculoskeletal: Positive for arthralgias, joint pain, joint swelling and morning stiffness. Negative for  myalgias, muscle weakness, muscle tenderness and myalgias.  Skin: Negative for color change, rash and redness.  Allergic/Immunologic: Negative for susceptible to infections.  Neurological: Negative for dizziness, numbness, headaches, memory loss and weakness.  Hematological: Negative for swollen glands.  Psychiatric/Behavioral: Positive for sleep disturbance. Negative for confusion.    PMFS History:  Patient Active Problem List   Diagnosis Date Noted  . Epigastric burning sensation 02/28/2020  . Screening for colorectal cancer 02/28/2020  . Osteoarthritis 03/15/2019  . Flu vaccine need 03/15/2019  . Human immunodeficiency virus (HIV) disease (HCC) 08/25/2018    Past Medical History:  Diagnosis Date  . HIV (human immunodeficiency virus infection) (HCC)   . Hypertension     Family History  Problem Relation Age of Onset  . COPD Mother   . Cancer Father   . Arthritis Father   . Alcohol abuse Father   . Arthritis Sister   . Bipolar disorder Sister   . Hypertension Brother   . Hepatitis C Brother   . Healthy Daughter   . Healthy Daughter    Past Surgical History:  Procedure Laterality Date  . APPENDECTOMY  1999   Social History   Social History Narrative  . Not on file   Immunization History  Administered Date(s) Administered  . Influenza,inj,Quad PF,6+ Mos 03/15/2019  . Meningococcal Mcv4o 08/25/2018  . Moderna Sars-Covid-2 Vaccination 10/29/2019, 11/26/2019, 06/17/2020  . Pneumococcal Conjugate-13 08/25/2018     Objective: Vital Signs: BP 138/90 (BP Location: Left Arm, Patient Position: Sitting, Cuff Size: Normal)   Pulse 71   Ht 5\' 9"  (1.753 m)  Wt 233 lb 12.8 oz (106.1 kg)   BMI 34.53 kg/m    Physical Exam Vitals and nursing note reviewed.  Constitutional:      Appearance: He is well-developed and well-nourished.  HENT:     Head: Normocephalic and atraumatic.  Eyes:     Extraocular Movements: EOM normal.     Conjunctiva/sclera: Conjunctivae normal.      Pupils: Pupils are equal, round, and reactive to light.  Cardiovascular:     Rate and Rhythm: Normal rate and regular rhythm.     Heart sounds: Normal heart sounds.  Pulmonary:     Effort: Pulmonary effort is normal.     Breath sounds: Normal breath sounds.  Abdominal:     General: Bowel sounds are normal.     Palpations: Abdomen is soft.  Musculoskeletal:     Cervical back: Normal range of motion and neck supple.  Skin:    General: Skin is warm and dry.     Capillary Refill: Capillary refill takes less than 2 seconds.  Neurological:     Mental Status: He is alert and oriented to person, place, and time.  Psychiatric:        Mood and Affect: Mood and affect normal.        Behavior: Behavior normal.      Musculoskeletal Exam: He has good range of motion of his cervical spine without discomfort.  He had tenderness on palpation of bilateral SI joints.  Shoulder joints, elbow joints, wrist joints with good range of motion.  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  No synovitis was noted over wrist joints, MCPs, PIPs or DIPs.  Hip joints with good range of motion.  Knee joints with good range of motion with some crepitus.  There was no tenderness of her ankles MTPs or PIPs.  CDAI Exam: CDAI Score: -- Patient Global: --; Provider Global: -- Swollen: --; Tender: -- Joint Exam 06/22/2020   No joint exam has been documented for this visit   There is currently no information documented on the homunculus. Go to the Rheumatology activity and complete the homunculus joint exam.  Investigation: No additional findings.  Imaging: No results found.  Recent Labs: Lab Results  Component Value Date   WBC 8.0 05/12/2020   HGB 14.4 05/12/2020   PLT 318 05/12/2020   NA 139 05/12/2020   K 4.2 05/12/2020   CL 102 05/12/2020   CO2 28 05/12/2020   GLUCOSE 98 05/12/2020   BUN 14 05/12/2020   CREATININE 0.98 05/12/2020   BILITOT 0.3 05/12/2020   AST 15  05/12/2020   ALT 15 05/12/2020   PROT 7.7 05/12/2020   CALCIUM 9.4 05/12/2020   GFRAA 89 04/08/2019   QFTBGOLDPLUS NEGATIVE 08/03/2018    Speciality Comments: No specialty comments available.  Procedures:  No procedures performed Allergies: Patient has no known allergies.   Assessment / Plan:     Visit Diagnoses: Psoriatic arthritis (HCC)-he was diagnosed with psoriatic arthritis in [place].  He was initially treated with methotrexate but discontinued as it was not effective.  He had been off methotrexate since 2018.  He was evaluated by me in October 2020.  At the time he did not have any synovitis.  He was having some psoriasis lesions.  We discussed [facility] but he did not come for follow-up visits.  He complains of increased pain and discomfort in his hands and knee joints.  He also complains of increased lower back  pain.  No synovitis was noted on the examination today.  He states he is unable to work because of discomfort.  Psoriasis-he gets occasional patches behind his ears which responds to topical agents.  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  No synovitis was noted on the examination today.  Plan: XR Hand 2 View Right, XR Hand 2 View Left.  X-ray shows old erosive changes and osteoarthritic changes but no radiographic progression.  Primary osteoarthritis of both hands  Chronic pain of both knees -his initial x-ray showed mild chondromalacia patella.  Plan: XR KNEE 3 VIEW RIGHT, XR KNEE 3 VIEW LEFT.  No radiographic progression was noted.  Chondromalacia patella was noted.  Referral to physical therapy was placed.  Weight loss diet and exercise was discussed.  He states cutting back on chocolate helps his joint pain.  Chondromalacia of both patellae  Chronic SI joint pain -he complains of discomfort in the SI joints.  Plan: XR Pelvis 1-2 Views.  SI joint sclerosis was noted.  No comparison  films are available.  I discussed possible MRI of the SI joints in the future if he gets relief from physical therapy.  Chronic midline low back pain without sciatica-I will refer him to physical therapy.  Human immunodeficiency virus (HIV) disease (HCC)-diagnosed in 2002 and has been under care of Dr. Ronnie Derby.  Insomnia secondary to anxiety  Orders: Orders Placed This Encounter  Procedures  . XR Hand 2 View Right  . XR Hand 2 View Left  . XR KNEE 3 VIEW RIGHT  . XR KNEE 3 VIEW LEFT  . XR Pelvis 1-2 Views   No orders of the defined types were placed in this encounter.    Follow-Up Instructions: Return for Psoriatic arthritis, Osteoarthritis.   Pollyann Savoy, MD  Note - This record has been created using [employer].  Chart creation errors have been sought, but may not always  have been located. Such creation errors do not reflect on  the standard of medical care.

## 2020-06-22 ENCOUNTER — Ambulatory Visit: Payer: Self-pay

## 2020-06-22 ENCOUNTER — Ambulatory Visit: Payer: Medicaid Other | Admitting: Rheumatology

## 2020-06-22 ENCOUNTER — Encounter: Payer: Self-pay | Admitting: Rheumatology

## 2020-06-22 ENCOUNTER — Other Ambulatory Visit: Payer: Self-pay

## 2020-06-22 VITALS — BP 138/90 | HR 71 | Ht 69.0 in | Wt 233.8 lb

## 2020-06-22 DIAGNOSIS — M533 Sacrococcygeal disorders, not elsewhere classified: Secondary | ICD-10-CM | POA: Diagnosis not present

## 2020-06-22 DIAGNOSIS — M79642 Pain in left hand: Secondary | ICD-10-CM | POA: Diagnosis not present

## 2020-06-22 DIAGNOSIS — B20 Human immunodeficiency virus [HIV] disease: Secondary | ICD-10-CM

## 2020-06-22 DIAGNOSIS — F5105 Insomnia due to other mental disorder: Secondary | ICD-10-CM

## 2020-06-22 DIAGNOSIS — L4050 Arthropathic psoriasis, unspecified: Secondary | ICD-10-CM | POA: Diagnosis not present

## 2020-06-22 DIAGNOSIS — M79641 Pain in right hand: Secondary | ICD-10-CM

## 2020-06-22 DIAGNOSIS — M25561 Pain in right knee: Secondary | ICD-10-CM

## 2020-06-22 DIAGNOSIS — M2242 Chondromalacia patellae, left knee: Secondary | ICD-10-CM

## 2020-06-22 DIAGNOSIS — M19042 Primary osteoarthritis, left hand: Secondary | ICD-10-CM

## 2020-06-22 DIAGNOSIS — G8929 Other chronic pain: Secondary | ICD-10-CM | POA: Diagnosis not present

## 2020-06-22 DIAGNOSIS — F419 Anxiety disorder, unspecified: Secondary | ICD-10-CM

## 2020-06-22 DIAGNOSIS — M25562 Pain in left knee: Secondary | ICD-10-CM

## 2020-06-22 DIAGNOSIS — M19041 Primary osteoarthritis, right hand: Secondary | ICD-10-CM

## 2020-06-22 DIAGNOSIS — Z79899 Other long term (current) drug therapy: Secondary | ICD-10-CM | POA: Diagnosis not present

## 2020-06-22 DIAGNOSIS — L409 Psoriasis, unspecified: Secondary | ICD-10-CM

## 2020-06-22 DIAGNOSIS — M2241 Chondromalacia patellae, right knee: Secondary | ICD-10-CM

## 2020-06-22 DIAGNOSIS — M545 Low back pain, unspecified: Secondary | ICD-10-CM

## 2020-07-05 ENCOUNTER — Ambulatory Visit (HOSPITAL_COMMUNITY): Payer: Medicaid Other | Attending: Rheumatology | Admitting: Physical Therapy

## 2020-07-20 MED FILL — TIVICAY 50 MG TABLET: 50 | 30 days supply | Qty: 30 | Fill #10

## 2020-08-01 ENCOUNTER — Ambulatory Visit (HOSPITAL_COMMUNITY): Payer: Medicaid Other | Attending: Rheumatology

## 2020-08-08 ENCOUNTER — Telehealth: Payer: Self-pay | Admitting: Emergency Medicine

## 2020-08-08 ENCOUNTER — Other Ambulatory Visit: Payer: Self-pay

## 2020-08-08 ENCOUNTER — Ambulatory Visit
Admission: EM | Admit: 2020-08-08 | Discharge: 2020-08-08 | Disposition: A | Discharge status: Home / Self Care | Payer: Medicaid Other | Attending: Emergency Medicine | Admitting: Emergency Medicine

## 2020-08-08 DIAGNOSIS — R062 Wheezing: Secondary | ICD-10-CM

## 2020-08-08 DIAGNOSIS — J209 Acute bronchitis, unspecified: Secondary | ICD-10-CM

## 2020-08-08 MED ORDER — ALBUTEROL SULFATE (2.5 MG/3ML) 0.083% IN NEBU: 2.5000 mg | INHALATION_SOLUTION | Freq: Four times a day (QID) | RESPIRATORY_TRACT | 0 refills | Status: AC | PRN

## 2020-08-08 MED ORDER — BENZONATATE 100 MG PO CAPS: 100.0000 mg | ORAL_CAPSULE | Freq: Three times a day (TID) | ORAL | 0 refills | Status: DC

## 2020-08-08 MED ORDER — PREDNISONE 10 MG (21) PO TBPK: ORAL_TABLET | Freq: Every day | ORAL | 0 refills | Status: DC

## 2020-08-08 NOTE — ED Triage Notes (Signed)
Wheezing x 2 weeks, coughing x the past 2 days.

## 2020-08-08 NOTE — ED Provider Notes (Signed)
[facility]   [id number] 08/08/20 Arrival Time: 3762   CC: Wheezing  SUBJECTIVE: History from: patient.  Mitchell Meyers is a 50 y.o. male who presents with wheezing for a couple of days plus dry cough x couple of weeks.  Denies sick exposure to COVID, flu or strep.  Has tried OTC medications without relief.  Symptoms are made worse with lying down.  Reports previous hx of asthma in the past.   Denies fever, chills, fatigue, sinus pain, rhinorrhea, sore throat, SOB, chest pain, nausea, changes in bowel or bladder habits.    ROS: As per HPI.  All other pertinent ROS negative.     Past Medical History:  Diagnosis Date  . HIV (human immunodeficiency virus infection) (HCC)   . Hypertension    Past Surgical History:  Procedure Laterality Date  . APPENDECTOMY  1999   No Known Allergies No current facility-administered medications on file prior to encounter.   Current Outpatient Medications on File Prior to Encounter  Medication Sig Dispense Refill  . clotrimazole (LOTRIMIN) 1 % cream APPLY TOPICALLY 2 TIMES DAILY. 30 g 0  . diclofenac sodium (VOLTAREN) 1 % GEL APPLY 2 GRAMS TOPICALLY 4 TIMES DAILY. 400 g 1  . lidocaine (XYLOCAINE) 2 % solution Use as directed 15 mLs in the mouth or throat as needed for mouth pain (Do NOT exceed 8 doses in a 24 hour period). 100 mL 0  . LORazepam (ATIVAN) 2 MG tablet Take 2 mg by mouth daily as needed for anxiety or sleep.     Marland Kitchen losartan (COZAAR) 25 MG tablet Take 25 mg by mouth daily.    . naproxen (NAPROSYN) 500 MG tablet Take 1 tablet (500 mg total) by mouth 2 (two) times daily with a meal. (Patient taking differently: Take 500-1,000 mg by mouth daily as needed for mild pain.) 30 tablet 2  . TIVICAY 50 MG tablet TAKE 1 TABLET (50 MG TOTAL) BY MOUTH DAILY. (Patient taking differently: Take 50 mg by mouth daily.) 30 tablet 11   Social History   Socioeconomic History  . Marital status: Married    Spouse name: Not on file  . Number of  children: Not on file  . Years of education: Not on file  . Highest education level: Not on file  Occupational History  . Not on file  Tobacco Use  . Smoking status: Former Games developer  . Smokeless tobacco: Never Used  Vaping Use  . Vaping Use: Never used  Substance and Sexual Activity  . Alcohol use: Not Currently  . Drug use: Not Currently  . Sexual activity: Not on file  Other Topics Concern  . Not on file  Social History Narrative  . Not on file   Social Determinants of Health   Financial Resource Strain: Not on file  Food Insecurity: Not on file  Transportation Needs: Not on file  Physical Activity: Not on file  Stress: Not on file  Social Connections: Not on file  Intimate Partner Violence: Not on file   Family History  Problem Relation Age of Onset  . COPD Mother   . Cancer Father   . Arthritis Father   . Alcohol abuse Father   . Arthritis Sister   . Bipolar disorder Sister   . Hypertension Brother   . Hepatitis C Brother   . Healthy Daughter   . Healthy Daughter     OBJECTIVE:  Vitals:   08/08/20 0907  BP: (!) 149/92  Pulse: 81  Resp: 16  Temp: 97.7 F (36.5 C)  TempSrc: Oral  SpO2: 97%     General appearance: alert; appears mildly fatigued, but nontoxic; speaking in full sentences and tolerating own secretions HEENT: NCAT; Ears: EACs clear, TMs pearly gray; Eyes: PERRL.  EOM grossly intact. Nose: nares patent with trace clear rhinorrhea, Throat: oropharynx clear, tonsils non erythematous or enlarged, uvula midline  Neck: supple without LAD Lungs: unlabored respirations, symmetrical air entry; cough: absent; no respiratory distress; diffuse soft wheezes throughout bilateral lung fields Heart: regular rate and rhythm.  Skin: warm and dry Psychological: alert and cooperative; normal mood and affect   ASSESSMENT & PLAN:  1. Wheezing   2. Acute bronchitis, unspecified organism     Meds ordered this encounter  Medications  . predniSONE (STERAPRED  UNI-PAK 21 TAB) 10 MG (21) TBPK tablet    Sig: Take by mouth daily. Take 6 tabs by mouth daily  for 2 days, then 5 tabs for 2 days, then 4 tabs for 2 days, then 3 tabs for 2 days, 2 tabs for 2 days, then 1 tab by mouth daily for 2 days    Dispense:  42 tablet    Refill:  0    Order Specific Question:   Supervising Provider    Answer:   Eustace Moore [[id number]]  . benzonatate (TESSALON) 100 MG capsule    Sig: Take 1 capsule (100 mg total) by mouth every 8 (eight) hours.    Dispense:  21 capsule    Refill:  0    Order Specific Question:   Supervising Provider    Answer:   Eustace Moore [[id number]]  . albuterol (PROVENTIL) (2.5 MG/3ML) 0.083% nebulizer solution    Sig: Take 3 mLs (2.5 mg total) by nebulization every 6 (six) hours as needed for wheezing or shortness of breath.    Dispense:  75 mL    Refill:  0    Order Specific Question:   Supervising Provider    Answer:   Eustace Moore [[id number]]    COVID testing ordered.  It will take between 5-7 days for test results.  Someone will contact you regarding abnormal results.    In the meantime: You should remain isolated in your home for 10 days from symptom onset AND greater than 72 hours after symptoms resolution (absence of fever without the use of fever-reducing medication and improvement in respiratory symptoms), whichever is longer Get plenty of rest and push fluids Will cover for bronchitis.  Prednisone prescribed Tessalon Perles prescribed for cough Albuterol solution prescribed for nebulizer machiner Use OTC zyrtec for nasal congestion, runny nose, and/or sore throat Use OTC flonase for nasal congestion and runny nose Use medications daily for symptom relief Use OTC medications like ibuprofen or tylenol as needed fever or pain Call or go to the ED if you have any new or worsening symptoms such as fever, worsening cough, shortness of breath, chest tightness, chest pain, turning blue, changes in mental status, etc...    Reviewed expectations re: course of current medical issues. Questions answered. Outlined signs and symptoms indicating need for more acute intervention. Patient verbalized understanding. After Visit Summary given.         Rennis Harding, PA-C 08/08/20 [phone]

## 2020-08-08 NOTE — Discharge Instructions (Signed)
COVID testing ordered.  It will take between 5-7 days for test results.  Someone will contact you regarding abnormal results.    In the meantime: You should remain isolated in your home for 10 days from symptom onset AND greater than 72 hours after symptoms resolution (absence of fever without the use of fever-reducing medication and improvement in respiratory symptoms), whichever is longer Get plenty of rest and push fluids Will cover for bronchitis.  Prednisone prescribed Tessalon Perles prescribed for cough Albuterol solution prescribed for nebulizer machiner Use OTC zyrtec for nasal congestion, runny nose, and/or sore throat Use OTC flonase for nasal congestion and runny nose Use medications daily for symptom relief Use OTC medications like ibuprofen or tylenol as needed fever or pain Call or go to the ED if you have any new or worsening symptoms such as fever, worsening cough, shortness of breath, chest tightness, chest pain, turning blue, changes in mental status, etc..Marland Kitchen

## 2020-08-09 LAB — SARS-COV-2, NAA 2 DAY TAT

## 2020-08-09 LAB — NOVEL CORONAVIRUS, NAA: SARS-CoV-2, NAA: NOT DETECTED

## 2020-08-16 MED FILL — TIVICAY 50 MG TABLET: 50 | 30 days supply | Qty: 30 | Fill #11

## 2020-09-01 ENCOUNTER — Encounter (INDEPENDENT_AMBULATORY_CARE_PROVIDER_SITE_OTHER): Payer: Self-pay

## 2020-09-05 ENCOUNTER — Other Ambulatory Visit (HOSPITAL_COMMUNITY): Payer: Self-pay

## 2020-09-05 ENCOUNTER — Encounter (INDEPENDENT_AMBULATORY_CARE_PROVIDER_SITE_OTHER): Payer: Self-pay

## 2020-09-08 ENCOUNTER — Other Ambulatory Visit: Payer: Self-pay | Admitting: Internal Medicine

## 2020-09-08 DIAGNOSIS — B20 Human immunodeficiency virus [HIV] disease: Secondary | ICD-10-CM

## 2020-09-13 ENCOUNTER — Telehealth (INDEPENDENT_AMBULATORY_CARE_PROVIDER_SITE_OTHER): Payer: Self-pay

## 2020-09-13 ENCOUNTER — Encounter (INDEPENDENT_AMBULATORY_CARE_PROVIDER_SITE_OTHER): Payer: Self-pay

## 2020-09-13 ENCOUNTER — Other Ambulatory Visit (INDEPENDENT_AMBULATORY_CARE_PROVIDER_SITE_OTHER): Payer: Self-pay

## 2020-09-13 DIAGNOSIS — Z1211 Encounter for screening for malignant neoplasm of colon: Secondary | ICD-10-CM

## 2020-09-13 DIAGNOSIS — R1013 Epigastric pain: Secondary | ICD-10-CM

## 2020-09-13 MED ORDER — PEG 3350-KCL-NA BICARB-NACL 420 G PO SOLR: 4000.0000 mL | ORAL | 0 refills | Status: AC

## 2020-09-13 NOTE — Telephone Encounter (Signed)
LeighAnn Ellie Spickler, CMA  

## 2020-09-13 NOTE — Telephone Encounter (Signed)
Mitchell Meyers, CMA  

## 2020-09-15 ENCOUNTER — Ambulatory Visit (HOSPITAL_COMMUNITY)
Admission: RE | Admit: 2020-09-15 | Discharge: 2020-09-15 | Disposition: A | Discharge status: Home / Self Care | Payer: Medicaid Other | Source: Ambulatory Visit | Attending: Internal Medicine | Admitting: Internal Medicine

## 2020-09-15 ENCOUNTER — Other Ambulatory Visit: Payer: Self-pay

## 2020-09-15 ENCOUNTER — Other Ambulatory Visit (HOSPITAL_COMMUNITY)
Admission: RE | Admit: 2020-09-15 | Discharge: 2020-09-15 | Disposition: A | Discharge status: Home / Self Care | Payer: Medicaid Other | Source: Ambulatory Visit | Attending: Internal Medicine | Admitting: Internal Medicine

## 2020-09-15 ENCOUNTER — Ambulatory Visit: Payer: Medicaid Other | Admitting: Internal Medicine

## 2020-09-15 ENCOUNTER — Encounter: Payer: Self-pay | Admitting: Internal Medicine

## 2020-09-15 VITALS — BP 140/100 | HR 93 | Temp 98.1°F | Ht 69.0 in | Wt 234.6 lb

## 2020-09-15 DIAGNOSIS — J449 Chronic obstructive pulmonary disease, unspecified: Secondary | ICD-10-CM

## 2020-09-15 DIAGNOSIS — J4489 Other specified chronic obstructive pulmonary disease: Secondary | ICD-10-CM | POA: Insufficient documentation

## 2020-09-15 LAB — CBC WITH DIFFERENTIAL/PLATELET
Abs Immature Granulocytes: 0.03 10*3/uL (ref 0.00–0.07)
Basophils Absolute: 0 10*3/uL (ref 0.0–0.1)
Basophils Relative: 0 %
Eosinophils Absolute: 0.4 10*3/uL (ref 0.0–0.5)
Eosinophils Relative: 5 %
HCT: 42.2 % (ref 39.0–52.0)
Hemoglobin: 13.9 g/dL (ref 13.0–17.0)
Immature Granulocytes: 0 %
Lymphocytes Relative: 26 %
Lymphs Abs: 2.1 10*3/uL (ref 0.7–4.0)
MCH: 30.7 pg (ref 26.0–34.0)
MCHC: 32.9 g/dL (ref 30.0–36.0)
MCV: 93.2 fL (ref 80.0–100.0)
Monocytes Absolute: 0.6 10*3/uL (ref 0.1–1.0)
Monocytes Relative: 8 %
Neutro Abs: 4.9 10*3/uL (ref 1.7–7.7)
Neutrophils Relative %: 61 %
Platelets: 296 10*3/uL (ref 150–400)
RBC: 4.53 MIL/uL (ref 4.22–5.81)
RDW: 14.1 % (ref 11.5–15.5)
WBC: 8.1 10*3/uL (ref 4.0–10.5)
nRBC: 0 % (ref 0.0–0.2)

## 2020-09-15 MED ORDER — PREDNISONE 10 MG PO TABS: ORAL_TABLET | ORAL | 0 refills | Status: DC

## 2020-09-15 MED ORDER — BREZTRI AEROSPHERE 160-9-4.8 MCG/ACT IN AERO: 2.0000 | INHALATION_SPRAY | Freq: Two times a day (BID) | RESPIRATORY_TRACT | 0 refills | Status: DC

## 2020-09-15 MED ORDER — BREZTRI AEROSPHERE 160-9-4.8 MCG/ACT IN AERO: INHALATION_SPRAY | RESPIRATORY_TRACT | 11 refills | Status: DC

## 2020-09-15 MED ORDER — ALBUTEROL SULFATE HFA 108 (90 BASE) MCG/ACT IN AERS: INHALATION_SPRAY | RESPIRATORY_TRACT | 2 refills | Status: DC

## 2020-09-15 NOTE — Patient Instructions (Signed)
Plan A = Automatic = Always=    Breztri Take 2 puffs first thing in am and then another 2 puffs about 12 hours later.   Work on inhaler technique:  relax and gently blow all the way out then take a nice smooth deep breath back in, triggering the inhaler at same time you start breathing in.  Hold for up to 5 seconds if you can. Blow out thru nose. Rinse and gargle with water when done      Plan B = Backup (to supplement plan A, not to replace it) Only use your albuterol inhaler as a rescue medication to be used if you can't catch your breath by resting or doing a relaxed purse lip breathing pattern.  - The less you use it, the better it will work when you need it. - Ok to use the inhaler up to 2 puffs  every 4 hours if you must but call for appointment if use goes up over your usual need - Don't leave home without it !!  (think of it like the spare tire for your car)   Plan C = Crisis (instead of Plan B but only if Plan B stops working) - only use your albuterol nebulizer if you first try Plan B and it fails to help > ok to use the nebulizer up to every 4 hours but if start needing it regularly call for immediate appointment   Prednisone 10 mg take  4 each am x 2 days,   2 each am x 2 days,  1 each am x 2 days and stop    Please schedule a follow up office visit in 2 weeks, sooner if needed  with all medications /inhalers/ solutions in hand so we can verify exactly what you are taking. This includes all medications from all doctors and over the counters

## 2020-09-15 NOTE — Progress Notes (Signed)
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 Consult    Patient has been having fatigue and was diagnosed with Bronchitis about 2 weeks ago. Was given prednisone. Feels like he is wheezing. Has asthma as a child. Denies cough   Dyspnea:  MMRC2 = can't walk a nl pace on a flat grade s sob but does fine slow and flat  Cough: worse at hs / non productive assoc wheeze   SABA use: has neb no hfa > helps for a few hours   No obvious day to day or daytime variability or assoc excess/ purulent sputum or mucus plugs or hemoptysis or cp or chest tightness,  or overt sinus or hb symptoms.    Also denies any obvious fluctuation of symptoms with weather or environmental changes or other aggravating or alleviating factors except as outlined above   No unusual exposure hx or h/o childhood pna  or knowledge of premature birth.  Current Allergies, Complete Past Medical History, Past Surgical History, Family History, and Social History were reviewed in [employer] record.  ROS  The following are not active complaints unless bolded Hoarseness, sore throat, dysphagia, dental problems, itching, sneezing,  nasal congestion or discharge of excess mucus or purulent secretions, ear ache,   fever, chills, sweats, unintended wt loss or wt gain, classically pleuritic or exertional cp,  orthopnea pnd or arm/hand swelling  or leg swelling, presyncope, palpitations, abdominal pain,  anorexia, nausea, vomiting, diarrhea  or change in bowel habits or change in bladder habits, change in stools or change in urine, dysuria, hematuria,  rash, arthralgias, visual complaints, headache, numbness, weakness or ataxia or problems with walking or coordination,  change in mood or  memory.             Past Medical History:  Diagnosis Date  . HIV (human immunodeficiency virus infection) (HCC)   . Hypertension     Outpatient Medications Prior to Visit  Medication Sig Dispense Refill  . albuterol (PROVENTIL) (2.5 MG/3ML) 0.083% nebulizer solution Take 3 mLs (2.5 mg total) by nebulization every 6 (six) hours as needed for wheezing or shortness of breath. 75 mL 0  . clotrimazole (LOTRIMIN) 1 % cream APPLY TOPICALLY 2 TIMES DAILY. 30 g 0  . diclofenac sodium (VOLTAREN) 1 % GEL APPLY 2 GRAMS TOPICALLY 4 TIMES DAILY. 400 g 1  . dolutegravir (TIVICAY) 50 MG tablet Take 50 mg by mouth daily.    Marland Kitchen lidocaine (XYLOCAINE) 2 % solution Use as directed 15 mLs in the mouth or throat as needed for mouth pain (Do NOT exceed 8 doses in a 24 hour period). 100 mL 0  . naproxen (NAPROSYN) 500 MG tablet Take 1 tablet (500 mg total) by mouth 2 (two) times daily with a meal. 30 tablet 2  . polyethylene glycol-electrolytes (TRILYTE) 420 g solution Take 4,000 mLs by mouth as  directed. 4000 mL 0  . vitamin C (ASCORBIC ACID) 500 MG tablet Take 500 mg by mouth daily.    . benzonatate (TESSALON) 100 MG capsule Take 1 capsule (100 mg total) by mouth every 8 (eight) hours. 21 capsule 0   No facility-administered medications prior to visit.     Objective:     BP (!) 140/100 (BP Location: Left Arm, Patient Position: Sitting, Cuff Size: Normal)   Pulse 93   Temp 98.1 F (36.7 C) (Temporal)   Ht 5\' 9"  (1.753 m)   Wt 234 lb 9.6 oz (106.4 kg)   SpO2 96%   BMI 34.64 kg/m   SpO2: 96 %    HEENT : pt wearing mask not removed for exam due to covid -19 concerns.    NECK :  without JVD/Nodes/TM/ nl carotid  upstrokes bilaterally   LUNGS: no acc muscle use,  Nl contour chest pan exp wheezing  bilaterally without cough on insp or exp maneuvers   CV:  RRR  no s3 or murmur or increase in P2, and no edema   ABD:  Mod obese soft and nontender with nl inspiratory excursion in the supine position. No bruits or organomegaly appreciated, bowel sounds nl  MS:  Nl gait/ ext warm without deformities, calf tenderness, cyanosis or clubbing No obvious joint restrictions   SKIN: warm and dry without lesions    NEURO:  alert, approp, nl sensorium with  no motor or cerebellar deficits apparent.      CXR PA and Lateral:   09/15/2020 :    I personally reviewed images and agree with radiology impression as follows:   No active cardiopulmonary disease.   Labs ordered 09/15/2020  :  allergy profile   alpha one AT phenotype   Assessment   Asthmatic bronchitis , chronic (HCC) Asthma as child but out grew  s/p smoking cessation 2004  - Allergy profile 09/15/2020 >  Eos 0.4 /  IgE   - Labs ordered 09/15/2020  :  alpha one AT phenotype   - 09/15/2020  After extensive coaching inhaler device,  effectiveness =    90% > try breztri samples 2bid   Group D in terms of symptom/risk and laba/lama/ICS  therefore appropriate rx at this point >>>  breztri 2bid pending f/u ov with pfts to sort out asthma vs copd vs ACOS with approp saba  I spent extra time with pt today reviewing appropriate use of albuterol for prn use on exertion with the following points: 1) saba is for relief of sob that does not improve by walking a slower pace or resting but rather if the pt does not improve after trying this first. 2) If the pt is convinced, as many are, that saba helps recover from activity faster then it's easy to tell if this is the case by re-challenging : ie stop, take the inhaler, then p 5 minutes try the exact same activity (intensity of workload) that just caused the symptoms and see if they are substantially diminished or not after  saba 3) if there is an activity that reproducibly causes the symptoms, try the saba 15 min before the activity on alternate days   If in fact the saba really does help, then fine to continue to use it prn but advised may need to look closer at the maintenance regimen being used to achieve better control of airways disease with exertion.   >>> Prednisone 10 mg take  4 each am x 2 days,  2 each am x 2 days,  1 each am x 2 days and stop and f/u in 2 weeks to set up pfts           Each maintenance medication was reviewed in detail including emphasizing most importantly the difference between maintenance and prns and under what circumstances the prns are to be triggered using an action plan format where appropriate.  Total time for H and P, chart review, counseling, reviewing hfa device(s) and generating customized AVS unique to this office visit / same day charting =            Sandrea Hughs, MD 09/15/2020

## 2020-09-15 NOTE — Assessment & Plan Note (Addendum)
Asthma as child but out grew  s/p smoking cessation 2004  - Allergy profile 09/15/2020 >  Eos 0.4 /  IgE   - Labs ordered 09/15/2020  :  alpha one AT phenotype   - 09/15/2020  After extensive coaching inhaler device,  effectiveness =    90% > try breztri samples 2bid   Group D in terms of symptom/risk and laba/lama/ICS  therefore appropriate rx at this point >>>  breztri 2bid pending f/u ov with pfts to sort out asthma vs copd vs ACOS with approp saba  I spent extra time with pt today reviewing appropriate use of albuterol for prn use on exertion with the following points: 1) saba is for relief of sob that does not improve by walking a slower pace or resting but rather if the pt does not improve after trying this first. 2) If the pt is convinced, as many are, that saba helps recover from activity faster then it's easy to tell if this is the case by re-challenging : ie stop, take the inhaler, then p 5 minutes try the exact same activity (intensity of workload) that just caused the symptoms and see if they are substantially diminished or not after saba 3) if there is an activity that reproducibly causes the symptoms, try the saba 15 min before the activity on alternate days   If in fact the saba really does help, then fine to continue to use it prn but advised may need to look closer at the maintenance regimen being used to achieve better control of airways disease with exertion.   >>> Prednisone 10 mg take  4 each am x 2 days,   2 each am x 2 days,  1 each am x 2 days and stop and f/u in 2 weeks to set up pfts   >>> check with Dr Luciana Axe re ICS in this setting  (HIV on meds)           Each maintenance medication was reviewed in detail including emphasizing most importantly the difference between maintenance and prns and under what circumstances the prns are to be triggered using an action plan format where appropriate.  Total time for H and P, chart review, counseling, reviewing hfa device(s) and  generating customized AVS unique to this office visit / same day charting = 

## 2020-09-18 ENCOUNTER — Encounter: Payer: Self-pay | Admitting: *Deleted

## 2020-09-18 ENCOUNTER — Encounter (HOSPITAL_COMMUNITY): Payer: Self-pay | Admitting: Gastroenterology

## 2020-09-18 LAB — ALPHA-1 ANTITRYPSIN PHENOTYPE: A-1 Antitrypsin, Ser: 154 mg/dL (ref 101–187)

## 2020-09-19 ENCOUNTER — Encounter (HOSPITAL_COMMUNITY): Payer: Self-pay | Admitting: Anesthesiology

## 2020-09-19 ENCOUNTER — Other Ambulatory Visit: Payer: Self-pay

## 2020-09-19 ENCOUNTER — Other Ambulatory Visit (HOSPITAL_COMMUNITY)
Admission: RE | Admit: 2020-09-19 | Discharge: 2020-09-19 | Disposition: A | Discharge status: Home / Self Care | Payer: Medicaid Other | Source: Ambulatory Visit | Attending: Gastroenterology | Admitting: Gastroenterology

## 2020-09-19 ENCOUNTER — Other Ambulatory Visit (INDEPENDENT_AMBULATORY_CARE_PROVIDER_SITE_OTHER): Payer: Self-pay

## 2020-09-19 DIAGNOSIS — Z01812 Encounter for preprocedural laboratory examination: Secondary | ICD-10-CM | POA: Insufficient documentation

## 2020-09-19 DIAGNOSIS — Z20822 Contact with and (suspected) exposure to covid-19: Secondary | ICD-10-CM | POA: Insufficient documentation

## 2020-09-19 LAB — SARS CORONAVIRUS 2 (TAT 6-24 HRS): SARS Coronavirus 2: NEGATIVE

## 2020-09-19 NOTE — Progress Notes (Signed)
Called patient to make him aware he missed his 8:10 this morning. He knew about his appointment, patient said he was on his way. He got here after 10:00.

## 2020-09-20 ENCOUNTER — Encounter (INDEPENDENT_AMBULATORY_CARE_PROVIDER_SITE_OTHER): Payer: Self-pay

## 2020-09-20 ENCOUNTER — Ambulatory Visit (HOSPITAL_COMMUNITY): Admission: RE | Admit: 2020-09-20 | Payer: Medicaid Other | Source: Ambulatory Visit | Admitting: Gastroenterology

## 2020-09-20 ENCOUNTER — Encounter (HOSPITAL_COMMUNITY): Admission: RE | Payer: Self-pay | Source: Ambulatory Visit

## 2020-09-20 SURGERY — COLONOSCOPY WITH PROPOFOL
Anesthesia: Monitor Anesthesia Care

## 2020-09-22 LAB — IGE: IgE (Immunoglobulin E), Serum: 76 IU/mL (ref 6–495)

## 2020-09-25 ENCOUNTER — Other Ambulatory Visit (HOSPITAL_COMMUNITY): Payer: Self-pay

## 2020-09-27 ENCOUNTER — Other Ambulatory Visit: Payer: Self-pay | Admitting: Internal Medicine

## 2020-09-27 ENCOUNTER — Other Ambulatory Visit (HOSPITAL_COMMUNITY): Payer: Self-pay

## 2020-09-27 DIAGNOSIS — B20 Human immunodeficiency virus [HIV] disease: Secondary | ICD-10-CM

## 2020-10-03 ENCOUNTER — Other Ambulatory Visit (HOSPITAL_COMMUNITY): Payer: Self-pay

## 2020-10-10 ENCOUNTER — Ambulatory Visit: Payer: Self-pay | Admitting: Internal Medicine

## 2020-10-23 ENCOUNTER — Other Ambulatory Visit: Payer: Self-pay

## 2020-10-23 ENCOUNTER — Ambulatory Visit: Payer: Medicaid Other | Admitting: Internal Medicine

## 2020-10-23 NOTE — Progress Notes (Deleted)
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 Consult    Patient has been having fatigue and was diagnosed with Bronchitis about 2 weeks ago. Was given prednisone. Feels like he is wheezing. Has asthma as a child. Denies cough   Dyspnea:  MMRC2 = can't walk a nl pace on a flat grade s sob but does fine slow and flat  Cough: worse at hs / non productive assoc wheeze   SABA use: has neb no hfa > helps for a few hours  rec Plan A = Automatic = Always=    Breztri   Work on inhaler technique:     Plan B = Backup (to supplement plan A, not to replace it) Only use your albuterol inhaler as a rescue medication Plan C = Crisis (instead of Plan B but only if Plan B stops working) - only use your albuterol nebulizer if you first try Plan B and it fails to help > ok to use the nebulizer up to every 4 hours but if start needing it regularly call for immediate appointment Prednisone 10 mg take  4 each am x 2 days,   2 each am x 2 days,  1 each am x 2 days and stop   Please schedule a follow up office visit in 2 weeks, sooner if needed  with all medications /inhalers/ solutions in hand so we can verify exactly what you are taking. This includes all medications from all doctors and over the counters    10/23/2020  f/u ov/Mitchell Meyers office/Mitchell Meyers re: AB No chief complaint on file.   Dyspnea:  *** Cough: *** Sleeping: *** SABA use: *** 02: *** Covid status: *** Lung cancer  screening: ***   No obvious day to day or daytime variability or assoc excess/ purulent sputum or mucus plugs or hemoptysis or cp or chest tightness, subjective wheeze or overt sinus or hb symptoms.   *** without nocturnal  or early am exacerbation  of respiratory  c/o's or need for noct saba. Also denies any obvious fluctuation of symptoms with weather or environmental changes or other aggravating or alleviating factors except as outlined above   No unusual exposure hx or h/o childhood pna  or knowledge of premature birth.  Current Allergies, Complete Past Medical History, Past Surgical History, Family History, and Social History were reviewed in [employer] record.  ROS  The following are not active complaints unless bolded Hoarseness, sore throat, dysphagia, dental problems, itching, sneezing,  nasal congestion or discharge of excess mucus or purulent secretions, ear ache,   fever, chills, sweats, unintended wt loss or wt gain, classically pleuritic or exertional cp,  orthopnea pnd or arm/hand swelling  or leg swelling, presyncope, palpitations, abdominal pain, anorexia, nausea, vomiting, diarrhea  or change in bowel habits or change in bladder habits, change in stools or change in urine, dysuria,  hematuria,  rash, arthralgias, visual complaints, headache, numbness, weakness or ataxia or problems with walking or coordination,  change in mood or  memory.        No outpatient medications have been marked as taking for the 10/23/20 encounter (Appointment) with Nyoka Cowden, MD.                       Past Medical History:  Diagnosis Date  . HIV (human immunodeficiency virus infection) (HCC)   . Hypertension          Objective:     10/23/2020       ***  09/15/20 234 lb 9.6 oz (106.4 kg)  06/22/20 233 lb 12.8 oz (106.1 kg)  03/30/20 220 lb (99.8 kg)     Vital signs reviewed  10/23/2020  - Note at rest 02 sats  ***% on ***   General appearance:    ***             Assessment

## 2020-10-24 ENCOUNTER — Other Ambulatory Visit (HOSPITAL_COMMUNITY): Payer: Self-pay

## 2020-10-27 ENCOUNTER — Ambulatory Visit
Admission: EM | Admit: 2020-10-27 | Discharge: 2020-10-27 | Disposition: A | Discharge status: Home / Self Care | Payer: Medicaid Other | Attending: Emergency Medicine | Admitting: Emergency Medicine

## 2020-10-27 ENCOUNTER — Other Ambulatory Visit: Payer: Self-pay

## 2020-10-27 ENCOUNTER — Encounter: Payer: Self-pay | Admitting: Emergency Medicine

## 2020-10-27 DIAGNOSIS — R0981 Nasal congestion: Secondary | ICD-10-CM

## 2020-10-27 DIAGNOSIS — R062 Wheezing: Secondary | ICD-10-CM

## 2020-10-27 DIAGNOSIS — R6889 Other general symptoms and signs: Secondary | ICD-10-CM

## 2020-10-27 MED ORDER — PREDNISONE 20 MG PO TABS: 20.0000 mg | ORAL_TABLET | Freq: Two times a day (BID) | ORAL | 0 refills | Status: AC

## 2020-10-27 NOTE — Discharge Instructions (Addendum)
Get plenty of rest and push fluids Prednisone for wheezing and sinus congestion Follow up with PCP/ infectious disease for further evaluation and management of low body temperature Call or go to the ED if you have any new or worsening symptoms such as fever, cough, shortness of breath, chest tightness, chest pain, turning blue, changes in mental status, etc..Marland Kitchen

## 2020-10-27 NOTE — ED Triage Notes (Signed)
States his temp has been low for the past few days. No other symptoms.

## 2020-10-27 NOTE — ED Provider Notes (Signed)
[facility]   [id number] 10/27/20 Arrival Time: 1108   CC: Low body temperature and sinus congestion  SUBJECTIVE: History from: patient.  Mitchell Meyers is a 50 y.o. male who presents with low temperature for past few days "94-98" and sinus congestion x 1 week.  Hx significant for HIV.  Denies alleviating or aggravating factors. Reports previous symptoms in the past with sinus congestion.  Denies fever, chills, sore throat, SOB, chest pain, nausea, changes in bowel or bladder habits.    ROS: As per HPI.  All other pertinent ROS negative.     Past Medical History:  Diagnosis Date  . HIV (human immunodeficiency virus infection) (HCC)   . Hypertension    Past Surgical History:  Procedure Laterality Date  . APPENDECTOMY  1999   No Known Allergies No current facility-administered medications on file prior to encounter.   Current Outpatient Medications on File Prior to Encounter  Medication Sig Dispense Refill  . albuterol (PROAIR HFA) 108 (90 Base) MCG/ACT inhaler 2 puffs every 4 hours as needed only  if your can't catch your breath 18 g 2  . albuterol (PROVENTIL) (2.5 MG/3ML) 0.083% nebulizer solution Take 3 mLs (2.5 mg total) by nebulization every 6 (six) hours as needed for wheezing or shortness of breath. 75 mL 0  . Budeson-Glycopyrrol-Formoterol (BREZTRI AEROSPHERE) 160-9-4.8 MCG/ACT AERO Inhale 2 puffs into the lungs 2 (two) times daily. 10.7 g 0  . clotrimazole (LOTRIMIN) 1 % cream APPLY TOPICALLY 2 TIMES DAILY. 30 g 0  . diclofenac sodium (VOLTAREN) 1 % GEL APPLY 2 GRAMS TOPICALLY 4 TIMES DAILY. 400 g 1  . dolutegravir (TIVICAY) 50 MG tablet Take 50 mg by mouth daily.    Marland Kitchen lidocaine (XYLOCAINE) 2 % solution Use as directed 15 mLs in the mouth or throat as needed for mouth pain (Do NOT exceed 8 doses in a 24 hour period). 100 mL 0  . naproxen (NAPROSYN) 500 MG tablet Take 1 tablet (500 mg total) by mouth 2 (two) times daily with a meal. 30 tablet 2  . polyethylene  glycol-electrolytes (TRILYTE) 420 g solution Take 4,000 mLs by mouth as directed. 4000 mL 0  . vitamin C (ASCORBIC ACID) 500 MG tablet Take 500 mg by mouth daily.     Social History   Socioeconomic History  . Marital status: Married    Spouse name: Not on file  . Number of children: Not on file  . Years of education: Not on file  . Highest education level: Not on file  Occupational History  . Not on file  Tobacco Use  . Smoking status: Former Smoker    Types: Cigarettes    Quit date: 2004    Years since quitting: 18.3  . Smokeless tobacco: Never Used  . Tobacco comment: social  Vaping Use  . Vaping Use: Never used  Substance and Sexual Activity  . Alcohol use: Not Currently  . Drug use: Not Currently  . Sexual activity: Not on file  Other Topics Concern  . Not on file  Social History Narrative  . Not on file   Social Determinants of Health   Financial Resource Strain: Not on file  Food Insecurity: Not on file  Transportation Needs: Not on file  Physical Activity: Not on file  Stress: Not on file  Social Connections: Not on file  Intimate Partner Violence: Not on file   Family History  Problem Relation Age of Onset  . COPD Mother   . Cancer Father   .  Arthritis Father   . Alcohol abuse Father   . Arthritis Sister   . Bipolar disorder Sister   . Hypertension Brother   . Hepatitis C Brother   . Healthy Daughter   . Healthy Daughter     OBJECTIVE:  Vitals:   10/27/20 1157  BP: (!) 150/89  Pulse: 73  Resp: 18  Temp: 98 F (36.7 C)  TempSrc: Oral  SpO2: 94%    General appearance: alert; well-appearing, nontoxic; speaking in full sentences and tolerating own secretions HEENT: NCAT; Ears: EACs clear, TMs pearly gray; Eyes: PERRL.  EOM grossly intact.Nose: nares patent without rhinorrhea, Throat: oropharynx clear, tonsils non erythematous or enlarged, uvula midline  Neck: supple without LAD Lungs: unlabored respirations, symmetrical air entry; cough:  absent; no respiratory distress; subtle expiratory wheezes heard throughout bilateral lung fields Heart: regular rate and rhythm.  Skin: warm and dry Psychological: alert and cooperative; normal mood and affect   ASSESSMENT & PLAN:  1. Sinus congestion   2. Low body temperature   3. Wheezing     Meds ordered this encounter  Medications  . predniSONE (DELTASONE) 20 MG tablet    Sig: Take 1 tablet (20 mg total) by mouth 2 (two) times daily with a meal for 5 days.    Dispense:  10 tablet    Refill:  0    Order Specific Question:   Supervising Provider    Answer:   Eustace Moore [[id number]]   Get plenty of rest and push fluids Prednisone for wheezing and sinus congestion Follow up with PCP/ infectious disease for further evaluation and management of low body temperature Call or go to the ED if you have any new or worsening symptoms such as fever, cough, shortness of breath, chest tightness, chest pain, turning blue, changes in mental status, etc...   Reviewed expectations re: course of current medical issues. Questions answered. Outlined signs and symptoms indicating need for more acute intervention. Patient verbalized understanding. After Visit Summary given.         Rennis Harding, PA-C 10/27/20 1231

## 2020-10-30 ENCOUNTER — Other Ambulatory Visit (HOSPITAL_COMMUNITY): Payer: Self-pay

## 2020-11-01 ENCOUNTER — Other Ambulatory Visit: Payer: Medicaid Other

## 2020-11-01 ENCOUNTER — Other Ambulatory Visit: Payer: Self-pay

## 2020-11-01 ENCOUNTER — Other Ambulatory Visit (HOSPITAL_COMMUNITY)
Admission: RE | Admit: 2020-11-01 | Discharge: 2020-11-01 | Disposition: A | Discharge status: Home / Self Care | Payer: Medicaid Other | Source: Ambulatory Visit | Attending: Family | Admitting: Family

## 2020-11-01 DIAGNOSIS — Z113 Encounter for screening for infections with a predominantly sexual mode of transmission: Secondary | ICD-10-CM

## 2020-11-01 DIAGNOSIS — Z79899 Other long term (current) drug therapy: Secondary | ICD-10-CM

## 2020-11-01 DIAGNOSIS — B20 Human immunodeficiency virus [HIV] disease: Secondary | ICD-10-CM

## 2020-11-02 ENCOUNTER — Other Ambulatory Visit (HOSPITAL_COMMUNITY): Payer: Self-pay

## 2020-11-02 LAB — URINE CYTOLOGY ANCILLARY ONLY
Chlamydia: NEGATIVE
Comment: NEGATIVE
Comment: NORMAL
Neisseria Gonorrhea: NEGATIVE

## 2020-11-02 LAB — T-HELPER CELL (CD4) - (RCID CLINIC ONLY)
CD4 % Helper T Cell: 42 % (ref 33–65)
CD4 T Cell Abs: 595 /uL (ref 400–1790)

## 2020-11-02 NOTE — Telephone Encounter (Signed)
Patient called, wants to know why his WBC count is high and wants greg to know that he is taking prednisone that was prescribed by an NP at urgent care.   Sandie Ano, RN

## 2020-11-04 LAB — COMPREHENSIVE METABOLIC PANEL
AG Ratio: 1.1 (calc) (ref 1.0–2.5)
ALT: 13 U/L (ref 9–46)
AST: 10 U/L (ref 10–35)
Albumin: 4.1 g/dL (ref 3.6–5.1)
Alkaline phosphatase (APISO): 61 U/L (ref 35–144)
BUN: 15 mg/dL (ref 7–25)
CO2: 26 mmol/L (ref 20–32)
Calcium: 9.7 mg/dL (ref 8.6–10.3)
Chloride: 103 mmol/L (ref 98–110)
Creat: 0.88 mg/dL (ref 0.70–1.33)
Globulin: 3.9 g/dL (calc) — ABNORMAL HIGH (ref 1.9–3.7)
Glucose, Bld: 259 mg/dL — ABNORMAL HIGH (ref 65–99)
Potassium: 4.2 mmol/L (ref 3.5–5.3)
Sodium: 137 mmol/L (ref 135–146)
Total Bilirubin: 0.3 mg/dL (ref 0.2–1.2)
Total Protein: 8 g/dL (ref 6.1–8.1)

## 2020-11-04 LAB — CBC WITH DIFFERENTIAL/PLATELET
Absolute Monocytes: 422 cells/uL (ref 200–950)
Basophils Absolute: 26 cells/uL (ref 0–200)
Basophils Relative: 0.2 %
Eosinophils Absolute: 0 cells/uL — ABNORMAL LOW (ref 15–500)
Eosinophils Relative: 0 %
HCT: 42.6 % (ref 38.5–50.0)
Hemoglobin: 13.9 g/dL (ref 13.2–17.1)
Lymphs Abs: 1584 cells/uL (ref 850–3900)
MCH: 29.8 pg (ref 27.0–33.0)
MCHC: 32.6 g/dL (ref 32.0–36.0)
MCV: 91.2 fL (ref 80.0–100.0)
MPV: 9.9 fL (ref 7.5–12.5)
Monocytes Relative: 3.2 %
Neutro Abs: 11167 cells/uL — ABNORMAL HIGH (ref 1500–7800)
Neutrophils Relative %: 84.6 %
Platelets: 385 10*3/uL (ref 140–400)
RBC: 4.67 10*6/uL (ref 4.20–5.80)
RDW: 12.6 % (ref 11.0–15.0)
Total Lymphocyte: 12 %
WBC: 13.2 10*3/uL — ABNORMAL HIGH (ref 3.8–10.8)

## 2020-11-04 LAB — LIPID PANEL
Cholesterol: 221 mg/dL — ABNORMAL HIGH (ref <200)
HDL: 38 mg/dL — ABNORMAL LOW (ref >=40)
LDL Cholesterol (Calc): 159 mg/dL (calc) — ABNORMAL HIGH
Non-HDL Cholesterol (Calc): 183 mg/dL (calc) — ABNORMAL HIGH (ref <130)
Total CHOL/HDL Ratio: 5.8 (calc) — ABNORMAL HIGH (ref <5.0)
Triglycerides: 118 mg/dL (ref <150)

## 2020-11-04 LAB — HIV-1 RNA QUANT-NO REFLEX-BLD
HIV 1 RNA Quant: <20 Copies/mL — ABNORMAL HIGH
HIV-1 RNA Quant, Log: <1.3 Log cps/mL — ABNORMAL HIGH

## 2020-11-04 LAB — RPR: RPR Ser Ql: NONREACTIVE

## 2020-11-20 ENCOUNTER — Encounter: Payer: Medicaid Other | Admitting: Family

## 2020-11-24 MED ORDER — VALACYCLOVIR HCL 1 G PO TABS: 1000.0000 mg | ORAL_TABLET | Freq: Two times a day (BID) | ORAL | 0 refills | Status: DC

## 2020-11-24 NOTE — Addendum Note (Signed)
Addended by: Jeanine Luz D on: 11/24/2020 01:10 PM   Modules accepted: Orders

## 2020-11-27 ENCOUNTER — Ambulatory Visit: Payer: Self-pay | Admitting: Internal Medicine

## 2020-11-27 NOTE — Progress Notes (Deleted)
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 Was given prednisone. Feels like he is wheezing. Has asthma as a child. Denies cough   Dyspnea:  MMRC2 = can't walk a nl pace on a flat grade s sob but does fine slow and flat  Cough: worse at hs / non productive assoc wheeze   SABA use: has neb no hfa > helps for a few hours  Rec Plan A = Automatic = Always=    Breztri Take 2 puffs first thing in am and then another 2 puffs about 12 hours later.  Work on inhaler technique:   Plan B = Backup (to supplement plan A, not to replace it) Only use your albuterol inhaler as a rescue medication Plan C = Crisis (instead of Plan B but only if Plan B stops working) - only use your albuterol nebulizer if you first try Plan B and it fails to help Prednisone 10 mg take  4 each am x 2 days,   2 each am x 2 days,  1 each am x 2 days and stop   Please schedule a follow up office visit in 2 weeks, sooner if needed  with all medications /inhalers/ solutions in hand so we can verify exactly what you are taking. This includes all medications from all doctors and over the counters   11/27/2020  f/u ov/[place] office/Mitchell Meyers re:  No chief complaint on file.   Dyspnea:  *** Cough: *** Sleeping: *** SABA use: *** 02: *** Covid status: *** Lung cancer screening: ***   No obvious day to day or daytime  variability or assoc excess/ purulent sputum or mucus plugs or hemoptysis or cp or chest tightness, subjective wheeze or overt sinus or hb symptoms.  without nocturnal  or early am exacerbation  of respiratory  c/o's or need for noct saba. Also denies any obvious fluctuation of symptoms with weather or environmental changes or other aggravating or alleviating factors except as outlined above   No unusual exposure hx or h/o childhood pna/ asthma or knowledge of premature birth.  Current Allergies, Complete Past Medical History, Past Surgical History, Family History, and Social History were reviewed in [employer] record.  ROS  The following are not active complaints unless bolded Hoarseness, sore throat, dysphagia, dental problems, itching, sneezing,  nasal congestion or discharge of excess mucus or purulent secretions, ear ache,   fever, chills, sweats, unintended wt loss or wt gain, classically pleuritic or exertional cp,  orthopnea pnd or arm/hand swelling  or leg swelling, presyncope, palpitations, abdominal pain, anorexia, nausea, vomiting, diarrhea  or change in bowel habits or change in bladder habits, change in stools or change in urine, dysuria, hematuria,  rash, arthralgias, visual complaints, headache, numbness, weakness or ataxia or  problems with walking or coordination,  change in mood or  memory.        No outpatient medications have been marked as taking for the 11/27/20 encounter (Appointment) with Nyoka Cowden, MD.                        Past Medical History:  Diagnosis Date   HIV (human immunodeficiency virus infection) (HCC)    Hypertension        Objective:     Wt Readings from Last 3 Encounters:  09/15/20 234 lb 9.6 oz (106.4 kg)  06/22/20 233 lb 12.8 oz (106.1 kg)  03/30/20 220 lb (99.8 kg)      Vital signs reviewed  11/27/2020  - Note at rest 02 sats  ***% on ***   General appearance:    ***       Assessment

## 2020-11-28 ENCOUNTER — Telehealth: Payer: Self-pay | Admitting: Internal Medicine

## 2020-11-28 MED ORDER — BREZTRI AEROSPHERE 160-9-4.8 MCG/ACT IN AERO: 2.0000 | INHALATION_SPRAY | Freq: Two times a day (BID) | RESPIRATORY_TRACT | 11 refills | Status: DC

## 2020-11-28 NOTE — Telephone Encounter (Signed)
That's fine = Prednisone 10 mg take  4 each am x 2 days,   2 each am x 2 days,  1 each am x 2 days and stop

## 2020-11-28 NOTE — Telephone Encounter (Signed)
Called patient, call went straight to VM. I left a message for him to call back.

## 2020-11-28 NOTE — Telephone Encounter (Signed)
Spoke with the pt  He missed his appt for f/u in [place] with Dr Sherene Sires 11/27/20- he states he forgot  He has been rescheduled to 12/20/20  He is asking for rx for [facility] and I have sent this to his pharmacy  He also would like rx for pred taper to have on hand in case he needs it for wheezing He states breathing is unchanged since his last ov and he is not having any problems at this time Please advise thanks

## 2020-11-30 MED ORDER — PREDNISONE 10 MG PO TABS: ORAL_TABLET | ORAL | 0 refills | Status: DC

## 2020-11-30 NOTE — Telephone Encounter (Signed)
I called and spoke with the pt and made aware ok for pred taper per MW  Rx sent to preferred pharm  Nothing further needed

## 2020-12-08 ENCOUNTER — Telehealth: Payer: Self-pay | Admitting: Internal Medicine

## 2020-12-08 NOTE — Telephone Encounter (Signed)
Received a PA request from patient's pharmacy for [facility]. I attempted to do the PA online but was told that the PA would not be approved unless the patient has tried and failed two of the following medications: Advair Diskus/HFA, Symbicort or Dulera. I reviewed patient's chart and did not see mentioning of these medications.   Dr. Sherene Sires, please advise if you are ok with switching him to a covered medication (Advair, Symbicort or Dulera). Thanks!

## 2020-12-08 NOTE — Telephone Encounter (Signed)
LMTCB for the pt and forwarding to myself for f/u.

## 2020-12-08 NOTE — Telephone Encounter (Signed)
Was supposed to f/u well before now so rec change to symbicort 160 Take 2 puffs first thing in am and then another 2 puffs about 12 hours later.   And schdule pfts/ov same day next available and no refills beyond that date.

## 2020-12-12 ENCOUNTER — Encounter: Payer: Self-pay | Admitting: *Deleted

## 2020-12-12 NOTE — Telephone Encounter (Signed)
LMTCB and will go ahead and mail letter per protocol.

## 2020-12-14 ENCOUNTER — Ambulatory Visit
Admission: EM | Admit: 2020-12-14 | Discharge: 2020-12-14 | Disposition: A | Discharge status: Home / Self Care | Payer: Medicaid Other | Attending: Emergency Medicine | Admitting: Emergency Medicine

## 2020-12-14 ENCOUNTER — Other Ambulatory Visit: Payer: Self-pay

## 2020-12-14 DIAGNOSIS — Z1152 Encounter for screening for COVID-19: Secondary | ICD-10-CM

## 2020-12-14 NOTE — ED Triage Notes (Signed)
Wants covid test

## 2020-12-15 LAB — NOVEL CORONAVIRUS, NAA: SARS-CoV-2, NAA: NOT DETECTED

## 2020-12-15 LAB — SARS-COV-2, NAA 2 DAY TAT

## 2020-12-20 ENCOUNTER — Ambulatory Visit: Payer: Medicaid Other | Admitting: Internal Medicine

## 2020-12-20 ENCOUNTER — Encounter: Payer: Self-pay | Admitting: Internal Medicine

## 2020-12-20 ENCOUNTER — Other Ambulatory Visit: Payer: Self-pay

## 2020-12-20 DIAGNOSIS — J449 Chronic obstructive pulmonary disease, unspecified: Secondary | ICD-10-CM | POA: Diagnosis not present

## 2020-12-20 MED ORDER — BREZTRI AEROSPHERE 160-9-4.8 MCG/ACT IN AERO: 2.0000 | INHALATION_SPRAY | Freq: Two times a day (BID) | RESPIRATORY_TRACT | 0 refills | Status: DC

## 2020-12-20 MED ORDER — BUDESONIDE-FORMOTEROL FUMARATE 160-4.5 MCG/ACT IN AERO: INHALATION_SPRAY | RESPIRATORY_TRACT | 12 refills | Status: AC

## 2020-12-20 MED ORDER — PREDNISONE 10 MG PO TABS: ORAL_TABLET | ORAL | 0 refills | Status: DC

## 2020-12-20 MED ORDER — ALBUTEROL SULFATE HFA 108 (90 BASE) MCG/ACT IN AERS: INHALATION_SPRAY | RESPIRATORY_TRACT | 2 refills | Status: AC

## 2020-12-20 NOTE — Patient Instructions (Addendum)
Plan A = Automatic = Always=      Symbicort(Breztri) Take 2 puffs first thing in am and then another 2 puffs about 12 hours later.   Work on inhaler technique:  relax and gently blow all the way out then take a nice smooth full deep breath back in, triggering the inhaler at same time you start breathing in.  Hold for up to 5 seconds if you can. Blow out thru nose. Rinse and gargle with water when done.  If mouth or throat bother you at all,  try brushing teeth/gums/tongue with arm and hammer toothpaste/ make a slurry and gargle and spit out.       Plan B = Backup (to supplement plan A, not to replace it) Only use your albuterol inhaler as a rescue medication to be used if you can't catch your breath by resting or doing a relaxed purse lip breathing pattern.  - The less you use it, the better it will work when you need it. - Ok to use the inhaler up to 2 puffs  every 4 hours if you must but call for appointment if use goes up over your usual need - Don't leave home without it !!  (think of it like the spare tire for your car)   Plan C = Crisis (instead of Plan B but only if Plan B stops working) - only use your albuterol nebulizer if you first try Plan B and it fails to help > ok to use the nebulizer up to every 4 hours but if start needing it regularly call for immediate appointment    Prednisone 10 mg take  4 each am x 2 days,   2 each am x 2 days,  1 each am x 2 days and stop    Please schedule a follow up visit in 3 months but call sooner if needed  with all medications /inhalers/ solutions in hand so we can verify exactly what you are taking. This includes all medications from all doctors and over the counters

## 2020-12-20 NOTE — Assessment & Plan Note (Signed)
Asthma as child but out grew / stopped smoking 2004 - Allergy profile 09/15/2020 >  Eos 0.4 /  IgE  76  - Labs ordered 09/15/2020  :  alpha one AT phenotype  MM   Level 154  - 09/15/2020  After extensive coaching inhaler device,  effectiveness =    90% > try breztri samples 2bid  - 12/20/2020  After extensive coaching inhaler device,  effectiveness =    90% > breztri sample and symbicort 160 Take 2 puffs first thing in am and then another 2 puffs about 12 hours later.   Did not understand maint vs prns from last ov   Advised: If your breathing worsens or you need to use your rescue inhaler more than twice weekly or wake up more than twice a month with any respiratory symptoms or require more than two rescue inhalers per year, we need to see you right away because this means we're not controlling the underlying problem (inflammation) adequately.  Rescue inhalers (albuterol) do not control inflammation and overuse can lead to unnecessary and costly consequences.  They can make you feel better temporarily but eventually they will quit working effectively much as sleep aids lead to more insomnia if used regularly.    rec maint symb 160 2bid (p one sample of breztri)  F/u in 3 m with all meds in hand using a trust but verify approach to confirm accurate Medication  Reconciliation The principal here is that until we are certain that the  patients are doing what we've asked, it makes no sense to ask them to do more.          Each maintenance medication was reviewed in detail including emphasizing most importantly the difference between maintenance and prns and under what circumstances the prns are to be triggered using an action plan format where appropriate.  Total time for H and P, chart review, counseling, reviewing hfa device(s) and generating customized AVS unique to this office visit / same day charting = 37 min

## 2020-12-20 NOTE — Progress Notes (Signed)
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 Was given prednisone. Feels like he is wheezing. Has asthma as a child. Denies cough   Dyspnea:  MMRC2 = can't walk a nl pace on a flat grade s sob but does fine slow and flat  Cough: worse at hs / non productive assoc wheeze   SABA use: has neb no hfa > helps for a few hours  Rec Plan A = Automatic = Always=    Breztri Take 2 puffs first thing in am and then another 2 puffs about 12 hours later.  Work on inhaler technique:   Plan B = Backup (to supplement plan A, not to replace it) Only use your albuterol inhaler as a rescue medication  Plan C = Crisis (instead of Plan B but only if Plan B stops working) - only use your albuterol nebulizer if you first try Plan B and it fails to help  Prednisone 10 mg take  4 each am x 2 days,   2 each am x 2 days,  1 each am x 2 days and stop   Please schedule a follow up office visit in 2 weeks, sooner if needed  with all medications /inhalers/ solutions in hand so we can verify exactly what you are taking. This includes all medications from all doctors and over the counters     12/20/2020  f/u ov/[place] office/Mitchell Meyers re: AB Chief Complaint  Patient presents with   Follow-up    Overall breathing doing better. He wheezes occ and has sensation of something in his chest when he lies on his left  side.     Dyspnea:  MMRC2 = can't walk a nl pace on a flat grade s sob but does fine slow and flat eg  Cough: congested with subj wheeze, nothing purulent, always better on saba, not on any maint rx  Sleeping: freq cough/ wheeze  SABA use: was too much neb sba  02: none  Covid status: x 3 vax     No obvious day to day or daytime variability or assoc purulent sputum or mucus plugs or hemoptysis or cp or chest tightness,  overt sinus or hb symptoms.     Also denies any obvious fluctuation of symptoms with weather or environmental changes or other aggravating or alleviating factors except as outlined above   No unusual exposure hx or h/o childhood pna/ asthma or knowledge of premature birth.  Current Allergies, Complete Past Medical History, Past Surgical History, Family History, and Social History were reviewed in [employer] record.  ROS  The following are not active complaints unless bolded Hoarseness, sore throat, dysphagia, dental problems, itching, sneezing,  nasal congestion or discharge of excess mucus or purulent secretions, ear ache,   fever,  chills, sweats, unintended wt loss or wt gain, classically pleuritic or exertional cp,  orthopnea pnd or arm/hand swelling  or leg swelling, presyncope, palpitations, abdominal pain, anorexia, nausea, vomiting, diarrhea  or change in bowel habits or change in bladder habits, change in stools or change in urine, dysuria, hematuria,  rash, arthralgias, visual complaints, headache, numbness, weakness or ataxia or problems with walking or coordination,  change in mood or  memory.        Current Meds  Medication Sig   albuterol (PROVENTIL) (2.5 MG/3ML) 0.083% nebulizer solution Take 3 mLs (2.5 mg total) by nebulization every 6 (six) hours as needed for wheezing or shortness of breath.   budesonide-formoterol (SYMBICORT) 160-4.5 MCG/ACT inhaler Take 2 puffs first thing in am and then another 2 puffs about 12 hours later.    clotrimazole (LOTRIMIN) 1 % cream APPLY TOPICALLY 2 TIMES DAILY.   diclofenac sodium (VOLTAREN) 1 % GEL APPLY 2 GRAMS TOPICALLY 4 TIMES DAILY.   dolutegravir (TIVICAY) 50 MG tablet Take 50 mg by mouth daily.   lidocaine (XYLOCAINE) 2 % solution Use as directed 15 mLs in the mouth or throat as needed for mouth pain (Do NOT exceed 8 doses in a 24 hour period).   naproxen (NAPROSYN) 500 MG tablet Take 1 tablet (500 mg total) by mouth 2 (two) times daily with a meal.   polyethylene glycol-electrolytes (TRILYTE) 420 g solution Take 4,000 mLs by mouth as directed.   predniSONE (DELTASONE) 10 MG tablet Take  4 each am x 2 days,   2 each am x 2 days,  1 each am x 2 days and stop   valACYclovir (VALTREX) 1000 MG tablet Take 1 tablet (1,000 mg total) by mouth 2 (two) times daily.   vitamin C (ASCORBIC ACID) 500 MG tablet Take 500 mg by mouth daily.   [DISCONTINUED] albuterol (PROAIR HFA) 108 (90 Base) MCG/ACT inhaler 2 puffs every 4 hours as needed only  if your can't catch your breath   [DISCONTINUED] Budeson-Glycopyrrol-Formoterol (BREZTRI AEROSPHERE) 160-9-4.8 MCG/ACT AERO Inhale 2 puffs into the lungs 2 (two) times daily.   [DISCONTINUED] Budeson-Glycopyrrol-Formoterol (BREZTRI AEROSPHERE) 160-9-4.8 MCG/ACT AERO Inhale 2 puffs into the lungs in the morning and at bedtime.   [DISCONTINUED] Budeson-Glycopyrrol-Formoterol (BREZTRI AEROSPHERE) 160-9-4.8 MCG/ACT AERO Inhale 2 puffs into the lungs in the morning and at bedtime.                 Past Medical History:  Diagnosis Date   HIV (human immunodeficiency virus infection) (HCC)    Hypertension        Objective:       Wt Readings from Last 3 Encounters:  12/20/20 231 lb (104.8 kg)  09/15/20 234 lb 9.6 oz (106.4 kg)  06/22/20 233 lb 12.8 oz (106.1 kg)      Vital signs reviewed  12/20/2020  - Note at rest 02 sats  98% on RA   General appearance:    amb wm, congested sounding cough      HEENT : pt wearing mask not removed for exam  due to covid -19 concerns.    NECK :  without JVD/Nodes/TM/ nl carotid upstrokes bilaterally   LUNGS: no acc muscle use,  Nl contour chest  mid exp wheeze bilaterally without cough on insp or exp maneuvers   CV:  RRR  no s3 or murmur or increase in P2, and no edema   ABD:  soft and nontender with nl inspiratory excursion in the supine position. No bruits or  organomegaly appreciated, bowel sounds nl  MS:  Nl gait/ ext warm without deformities, calf tenderness, cyanosis or clubbing No obvious joint restrictions   SKIN: warm and dry without lesions    NEURO:  alert, approp, nl sensorium with  no motor or cerebellar deficits apparent.    I personally reviewed images and agree with radiology impression as follows:  CXR:   09/15/20  No active cardiopulmonary disease.  Assessment

## 2020-12-25 ENCOUNTER — Encounter (INDEPENDENT_AMBULATORY_CARE_PROVIDER_SITE_OTHER): Payer: Self-pay

## 2020-12-26 DIAGNOSIS — J310 Chronic rhinitis: Secondary | ICD-10-CM

## 2020-12-26 NOTE — Telephone Encounter (Signed)
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

## 2020-12-26 NOTE — Telephone Encounter (Signed)
Dr Sherene Sires, please advise on pt email, thanks!  Annye English, MD 34 minutes ago (3:46 PM)   GG   I have a question. I have a deviated septum.Marland Kitcheni've always breath through my mouth. But since nov,2021 i've been taking afrin daily 2X3 a day. Can that be the cause of the sound

## 2020-12-28 MED ORDER — FLUTICASONE PROPIONATE 50 MCG/ACT NA SUSP: 2.0000 | Freq: Two times a day (BID) | NASAL | 2 refills | Status: AC

## 2021-01-01 ENCOUNTER — Telehealth (INDEPENDENT_AMBULATORY_CARE_PROVIDER_SITE_OTHER): Payer: Self-pay

## 2021-01-01 ENCOUNTER — Encounter (INDEPENDENT_AMBULATORY_CARE_PROVIDER_SITE_OTHER): Payer: Self-pay

## 2021-01-01 ENCOUNTER — Other Ambulatory Visit (INDEPENDENT_AMBULATORY_CARE_PROVIDER_SITE_OTHER): Payer: Self-pay

## 2021-01-01 DIAGNOSIS — Z1211 Encounter for screening for malignant neoplasm of colon: Secondary | ICD-10-CM

## 2021-01-01 DIAGNOSIS — R1013 Epigastric pain: Secondary | ICD-10-CM

## 2021-01-01 MED ORDER — PEG 3350-KCL-NA BICARB-NACL 420 G PO SOLR
4000.0000 mL | ORAL | 0 refills | Status: AC
Start: 2021-01-01 — End: ?

## 2021-01-01 NOTE — Telephone Encounter (Signed)
LeighAnn Allea Kassner, CMA  

## 2021-01-09 ENCOUNTER — Encounter (INDEPENDENT_AMBULATORY_CARE_PROVIDER_SITE_OTHER): Payer: Self-pay

## 2021-01-09 ENCOUNTER — Other Ambulatory Visit (INDEPENDENT_AMBULATORY_CARE_PROVIDER_SITE_OTHER): Payer: Self-pay

## 2021-01-11 ENCOUNTER — Telehealth: Payer: Self-pay

## 2021-01-11 ENCOUNTER — Other Ambulatory Visit (HOSPITAL_COMMUNITY): Payer: Self-pay

## 2021-01-11 NOTE — Telephone Encounter (Signed)
CMA called patient regarding the following message sent via mychart; Appointment Request From: Britt Boozer   With Provider: Gardiner Barefoot, MD [facility]]   Preferred Date Range: 01/24/2021 - 01/24/2021   Preferred Times: Any Time   Reason for visit: Office Visit   Comments: I need my meds. It's incredible that you've withheld my meds for 3 months. Regardless if i saw the dr or not. That's irrelevant.  Patient has not seen a provider since 2020. Had labs done on 11/01/20.  Spoke with [facility] who states patient last filled Tivicay on 3/9. Did not have any refills on file. Not able to reach patient at this time regarding message.  Juanita Laster, RMA

## 2021-01-12 NOTE — Telephone Encounter (Signed)
Attempted to call patient to schedule earlier appointment. Not able to reach patient at this time. Will forward message to triage to continue to follow.  Spoke with Cassie regarding patient ART. Patient has refused to fill Descovy at pharmacy. Has only been taking Tivicay.  Juanita Laster, RMA

## 2021-01-15 ENCOUNTER — Encounter (HOSPITAL_COMMUNITY): Payer: Medicaid Other

## 2021-01-17 ENCOUNTER — Ambulatory Visit (HOSPITAL_COMMUNITY): Admit: 2021-01-17 | Payer: Medicaid Other | Admitting: Internal Medicine

## 2021-01-17 ENCOUNTER — Encounter (HOSPITAL_COMMUNITY): Admission: RE | Payer: Self-pay | Source: Home / Self Care

## 2021-01-17 ENCOUNTER — Ambulatory Visit (HOSPITAL_COMMUNITY): Admission: RE | Admit: 2021-01-17 | Payer: Medicaid Other | Source: Home / Self Care | Admitting: Gastroenterology

## 2021-01-17 ENCOUNTER — Encounter (HOSPITAL_COMMUNITY): Payer: Self-pay

## 2021-01-17 SURGERY — COLONOSCOPY WITH PROPOFOL
Anesthesia: Monitor Anesthesia Care

## 2021-01-24 ENCOUNTER — Ambulatory Visit: Payer: Medicaid Other | Admitting: Internal Medicine

## 2021-02-02 ENCOUNTER — Ambulatory Visit: Payer: Medicaid Other | Admitting: Pharmacist

## 2021-02-07 ENCOUNTER — Ambulatory Visit: Payer: Medicaid Other | Admitting: Pharmacist

## 2021-02-11 ENCOUNTER — Encounter (INDEPENDENT_AMBULATORY_CARE_PROVIDER_SITE_OTHER): Payer: Self-pay

## 2021-02-20 ENCOUNTER — Ambulatory Visit: Payer: Medicaid Other | Admitting: Pharmacist

## 2021-02-21 ENCOUNTER — Ambulatory Visit: Payer: Medicaid Other | Admitting: Pharmacist

## 2021-02-21 ENCOUNTER — Encounter (INDEPENDENT_AMBULATORY_CARE_PROVIDER_SITE_OTHER): Payer: Self-pay | Admitting: *Deleted

## 2021-02-21 ENCOUNTER — Ambulatory Visit: Payer: Medicaid Other | Admitting: Internal Medicine

## 2021-02-21 NOTE — Progress Notes (Signed)
Letter mailed

## 2021-02-22 ENCOUNTER — Ambulatory Visit (INDEPENDENT_AMBULATORY_CARE_PROVIDER_SITE_OTHER): Payer: Medicaid Other | Admitting: Pharmacist

## 2021-02-22 ENCOUNTER — Other Ambulatory Visit: Payer: Self-pay

## 2021-02-22 ENCOUNTER — Other Ambulatory Visit (HOSPITAL_COMMUNITY): Payer: Self-pay

## 2021-02-22 ENCOUNTER — Ambulatory Visit: Payer: Medicaid Other | Admitting: Pharmacist

## 2021-02-22 DIAGNOSIS — Z23 Encounter for immunization: Secondary | ICD-10-CM

## 2021-02-22 DIAGNOSIS — B20 Human immunodeficiency virus [HIV] disease: Secondary | ICD-10-CM | POA: Diagnosis not present

## 2021-02-22 MED ORDER — BICTEGRAVIR-EMTRICITAB-TENOFOV 50-200-25 MG PO TABS
1.0000 | ORAL_TABLET | Freq: Every day | ORAL | 2 refills | Status: DC
  Filled 2021-02-22 – 2021-02-23 (×2): qty 30, 30d supply, fill #0

## 2021-02-22 NOTE — Progress Notes (Signed)
HPI: Mitchell Meyers is a 50 y.o. male who presents to the [facility] pharmacy clinic for HIV follow-up.  Patient Active Problem List   Diagnosis Date Noted   Asthmatic bronchitis , chronic (HCC) 09/15/2020   Epigastric burning sensation 02/28/2020   Screening for colorectal cancer 02/28/2020   Osteoarthritis 03/15/2019   Flu vaccine need 03/15/2019   Human immunodeficiency virus (HIV) disease (HCC) 08/25/2018    Patient's Medications  New Prescriptions   No medications on file  Previous Medications   ALBUTEROL (PROAIR HFA) 108 (90 BASE) MCG/ACT INHALER    2 puffs every 4 hours as needed only  if your can't catch your breath   ALBUTEROL (PROVENTIL) (2.5 MG/3ML) 0.083% NEBULIZER SOLUTION    Take 3 mLs (2.5 mg total) by nebulization every 6 (six) hours as needed for wheezing or shortness of breath.   BUDESONIDE-FORMOTEROL (SYMBICORT) 160-4.5 MCG/ACT INHALER    Take 2 puffs first thing in am and then another 2 puffs about 12 hours later.   CLOTRIMAZOLE (LOTRIMIN) 1 % CREAM    APPLY TOPICALLY 2 TIMES DAILY.   DICLOFENAC SODIUM (VOLTAREN) 1 % GEL    APPLY 2 GRAMS TOPICALLY 4 TIMES DAILY.   DOLUTEGRAVIR (TIVICAY) 50 MG TABLET    Take 50 mg by mouth daily.   FLUTICASONE (FLONASE) 50 MCG/ACT NASAL SPRAY    Place 2 sprays into both nostrils in the morning and at bedtime.   LIDOCAINE (XYLOCAINE) 2 % SOLUTION    Use as directed 15 mLs in the mouth or throat as needed for mouth pain (Do NOT exceed 8 doses in a 24 hour period).   LORAZEPAM (ATIVAN) 2 MG TABLET    Take 2 mg by mouth at bedtime.   NAPROXEN (NAPROSYN) 500 MG TABLET    Take 1 tablet (500 mg total) by mouth 2 (two) times daily with a meal.   POLYETHYLENE GLYCOL-ELECTROLYTES (TRILYTE) 420 G SOLUTION    Take 4,000 mLs by mouth as directed.   POLYETHYLENE GLYCOL-ELECTROLYTES (TRILYTE) 420 G SOLUTION    Take 4,000 mLs by mouth as directed.   VALACYCLOVIR (VALTREX) 1000 MG TABLET    Take 1 tablet (1,000 mg total) by mouth 2 (two) times daily.    VITAMIN C (ASCORBIC ACID) 500 MG TABLET    Take 500 mg by mouth daily.  Modified Medications   No medications on file  Discontinued Medications   No medications on file    Allergies: No Known Allergies  Past Medical History: Past Medical History:  Diagnosis Date   HIV (human immunodeficiency virus infection) (HCC)    Hypertension     Social History: Social History   Socioeconomic History   Marital status: Married    Spouse name: Not on file   Number of children: Not on file   Years of education: Not on file   Highest education level: Not on file  Occupational History   Not on file  Tobacco Use   Smoking status: Former    Types: Cigarettes    Quit date: 2004    Years since quitting: 18.7   Smokeless tobacco: Never   Tobacco comments:    social  Building services engineer Use: Never used  Substance and Sexual Activity   Alcohol use: Not Currently   Drug use: Not Currently   Sexual activity: Not on file  Other Topics Concern   Not on file  Social History Narrative   Not on file   Social Determinants of Health  Financial Resource Strain: Not on file  Food Insecurity: Not on file  Transportation Needs: Not on file  Physical Activity: Not on file  Stress: Not on file  Social Connections: Not on file    Labs: Lab Results  Component Value Date   HIV1RNAQUANT <20 (H) 11/01/2020   HIV1RNAQUANT <20 05/12/2020   HIV1RNAQUANT <20 NOT DETECTED 12/21/2019   CD4TABS 595 11/01/2020   CD4TABS 888 05/12/2020   CD4TABS 844 12/21/2019    RPR and STI Lab Results  Component Value Date   LABRPR NON-REACTIVE 11/01/2020   LABRPR NON-REACTIVE 12/21/2019   LABRPR NON-REACTIVE 08/03/2018    STI Results GC CT  11/01/2020 Negative Negative  12/21/2019 Negative Negative  08/03/2018 Negative Negative    Hepatitis B Lab Results  Component Value Date   HEPBSAB REACTIVE (A) 08/03/2018   HEPBSAG NON-REACTIVE 08/03/2018   HEPBCAB NON-REACTIVE 08/03/2018   Hepatitis C Lab  Results  Component Value Date   HEPCAB NON-REACTIVE 08/03/2018   Hepatitis A Lab Results  Component Value Date   HAV REACTIVE (A) 08/03/2018   Lipids: Lab Results  Component Value Date   CHOL 221 (H) 11/01/2020   TRIG 118 11/01/2020   HDL 38 (L) 11/01/2020   CHOLHDL 5.8 (H) 11/01/2020   LDLCALC 159 (H) 11/01/2020    Current HIV Regimen: Tivicay (per patient)  Assessment: Mitchell Meyers presents to clinic today for HIV follow-up. He has missed multiple appointments with Dr. Luciana Axe and with me. He has not been seen here since October 2020, and we stopped refilling his medications as he required further testing. He has been prescribed Tivicay + Descovy by Dr. Luciana Axe, but he has only been taking Tivicay. He states he has only been taking Tivicay for 5 years and has been undetectable. I asked how many pills for HIV he took each day to confirm this, and he said only one. He ran out of medicine 3 months ago but last week found an extra bottle of Tivicay laying around and started taking it again by itself. I told him this is not a complete regimen and is not appropriate for him at this time.   He says he has not been coming to appointments because of his shifting work schedule. 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 He says every 3 months or so he changes which days of the week he is off and available for appointments. Right now he is available Monday and Tuesday, but that will change in November. Reviewed that he must call to reschedule appointments in order for them not to count as no shows. I told him he needs to keep his appointments even after rescheduling in order to keep getting his medications.   Will check HIV RNA with genotype, CD4 count, and RPR today. He has recent Bmet and CBC from May 2022 and lipid panel this year. He denies any sexual activity and states he is still married to his wife. He deferred monkeypox vaccine today, but administered the flu shot today. Will  start Biktarvy today pending lab results. Provided 3 weeks of samples and will send prescription to [facility] for shipping. Unable to run test claim today but will follow-up with copay later and need for any copay assistance. He is insured with Medicaid. Will follow-up with him in 2 months.  Plan: Stop [employer] Check HIV RNA with genotype, CD4 count, and RPR Administer flu shot  Follow-up with me on 11/15 at 10:45am  Margarite Gouge, PharmD, CPP  Clinical Pharmacist Practitioner Infectious Diseases Clinical Pharmacist [facility] 02/22/2021, 3:33 PM

## 2021-02-23 ENCOUNTER — Other Ambulatory Visit: Payer: Self-pay | Admitting: Pharmacist

## 2021-02-23 ENCOUNTER — Other Ambulatory Visit (HOSPITAL_COMMUNITY): Payer: Self-pay

## 2021-02-23 DIAGNOSIS — B20 Human immunodeficiency virus [HIV] disease: Secondary | ICD-10-CM

## 2021-02-23 LAB — T-HELPER CELLS (CD4) COUNT (NOT AT ARMC)
CD4 % Helper T Cell: 51 % (ref 33–65)
CD4 T Cell Abs: 958 /uL (ref 400–1790)

## 2021-02-23 MED ORDER — BICTEGRAVIR-EMTRICITAB-TENOFOV 50-200-25 MG PO TABS: 1.0000 | ORAL_TABLET | Freq: Every day | ORAL | 0 refills | Status: DC

## 2021-02-23 MED ORDER — TIVICAY 50 MG PO TABS
50.0000 mg | ORAL_TABLET | Freq: Every day | ORAL | 2 refills | Status: DC
  Filled 2021-02-23 – 2021-02-26 (×3): qty 30, 30d supply, fill #0
  Filled 2021-03-20: qty 30, 30d supply, fill #1
  Filled 2021-04-19: qty 30, 30d supply, fill #2

## 2021-02-23 MED ORDER — DESCOVY 200-25 MG PO TABS
1.0000 | ORAL_TABLET | Freq: Every day | ORAL | 2 refills | Status: DC
  Filled 2021-02-23 – 2021-02-26 (×3): qty 30, 30d supply, fill #0
  Filled 2021-03-20: qty 30, 30d supply, fill #1
  Filled 2021-04-19: qty 30, 30d supply, fill #2

## 2021-02-23 NOTE — Progress Notes (Signed)
Medication Samples have been provided to the patient.  Drug name: Biktarvy        Strength: 50/200/25 mg       Qty: 21 tablets (3 bottles) LOT: CHSYVB   Exp.Date: 6/23  Dosing instructions: Take one tablet by mouth once daily  The patient has been instructed regarding the correct time, dose, and frequency of taking this medication, including desired effects and most common side effects.   Margarite Gouge, PharmD, CPP Clinical Pharmacist Practitioner Infectious Diseases Clinical Pharmacist [facility]

## 2021-02-23 NOTE — Progress Notes (Signed)
Patient states he would like to continue Tivicay and Descovy together prior to changing clinics. Sent scripts to [facility] for home delivery.

## 2021-02-26 ENCOUNTER — Other Ambulatory Visit (HOSPITAL_COMMUNITY): Payer: Self-pay

## 2021-02-26 LAB — HIV RNA, RTPCR W/R GT (RTI, PI,INT)
HIV 1 RNA Quant: 35 copies/mL — ABNORMAL HIGH
HIV-1 RNA Quant, Log: 1.54 Log copies/mL — ABNORMAL HIGH

## 2021-02-26 LAB — RPR: RPR Ser Ql: NONREACTIVE

## 2021-02-27 ENCOUNTER — Other Ambulatory Visit (HOSPITAL_COMMUNITY): Payer: Self-pay

## 2021-03-20 ENCOUNTER — Other Ambulatory Visit (HOSPITAL_COMMUNITY): Payer: Self-pay

## 2021-03-20 LAB — COLOGUARD: COLOGUARD: NEGATIVE

## 2021-03-22 ENCOUNTER — Other Ambulatory Visit (HOSPITAL_COMMUNITY): Payer: Self-pay

## 2021-04-18 ENCOUNTER — Other Ambulatory Visit (HOSPITAL_COMMUNITY): Payer: Self-pay

## 2021-04-19 ENCOUNTER — Other Ambulatory Visit (HOSPITAL_COMMUNITY): Payer: Self-pay

## 2021-04-24 ENCOUNTER — Ambulatory Visit: Payer: Medicaid Other | Admitting: Pharmacist

## 2021-04-30 ENCOUNTER — Telehealth: Payer: Self-pay

## 2021-04-30 NOTE — Telephone Encounter (Signed)
Called patient to get an appointment scheduled, left voicemail to call us back to get scheduled.

## 2021-05-10 ENCOUNTER — Other Ambulatory Visit (HOSPITAL_COMMUNITY): Payer: Self-pay

## 2021-05-14 ENCOUNTER — Other Ambulatory Visit: Payer: Self-pay | Admitting: Pharmacist

## 2021-05-14 ENCOUNTER — Other Ambulatory Visit (HOSPITAL_COMMUNITY): Payer: Self-pay

## 2021-05-14 DIAGNOSIS — B20 Human immunodeficiency virus [HIV] disease: Secondary | ICD-10-CM

## 2021-05-14 MED ORDER — DESCOVY 200-25 MG PO TABS
1.0000 | ORAL_TABLET | Freq: Every day | ORAL | 0 refills | Status: DC
  Filled 2021-05-14: qty 30, 30d supply, fill #0

## 2021-05-14 MED ORDER — TIVICAY 50 MG PO TABS
50.0000 mg | ORAL_TABLET | Freq: Every day | ORAL | 0 refills | Status: DC
Start: 2021-05-14 — End: 2021-08-23
  Filled 2021-05-14: qty 30, 30d supply, fill #0

## 2021-05-15 ENCOUNTER — Other Ambulatory Visit (HOSPITAL_COMMUNITY): Payer: Self-pay

## 2021-06-08 ENCOUNTER — Other Ambulatory Visit (HOSPITAL_COMMUNITY): Payer: Self-pay

## 2021-06-11 IMAGING — DX DG CHEST 2V
2 series · 2 of 2 positions shown · non-contrast
Comparison: None.

CLINICAL DATA: Cough, shortness of breath.

EXAM:
CHEST - 2 VIEW

[chest pa]
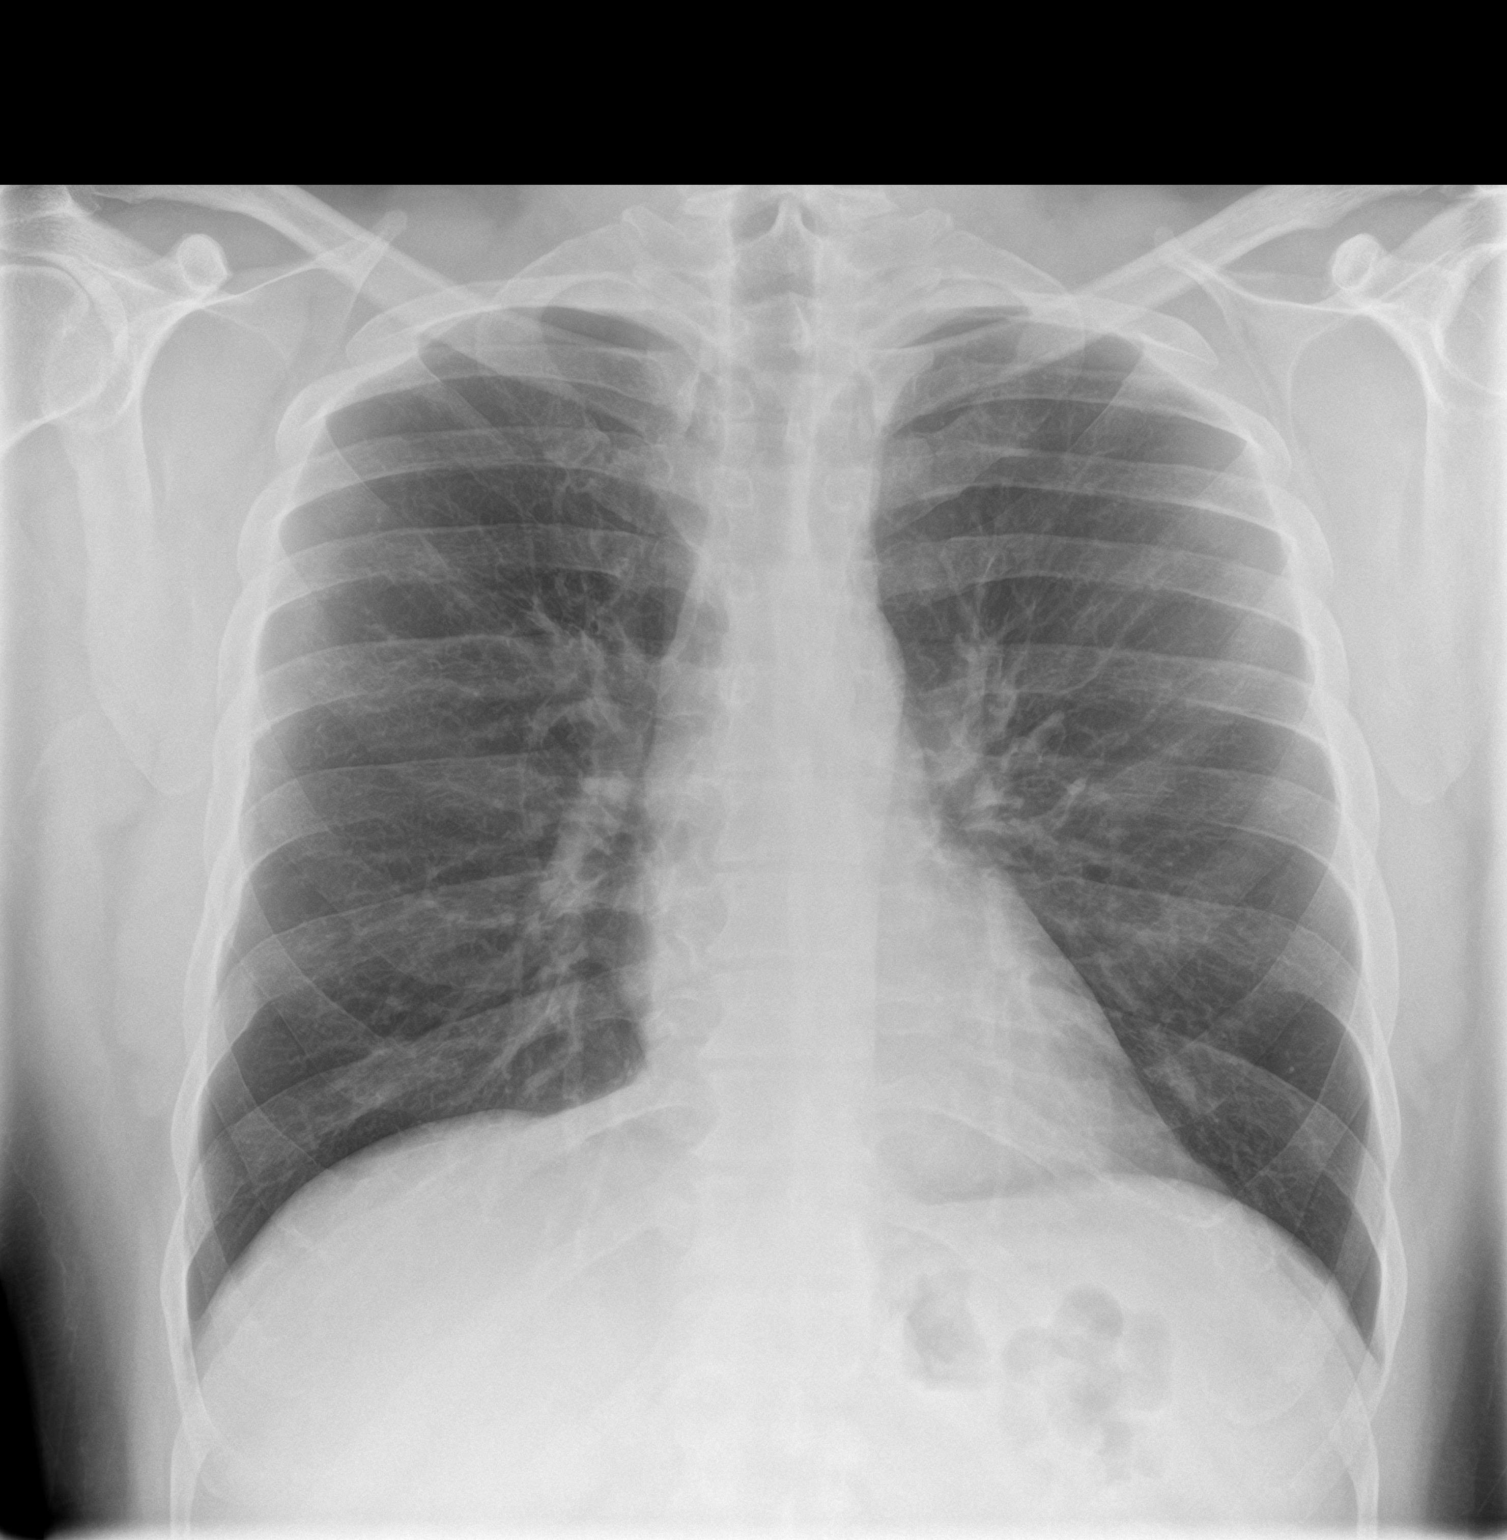

[chest lat]
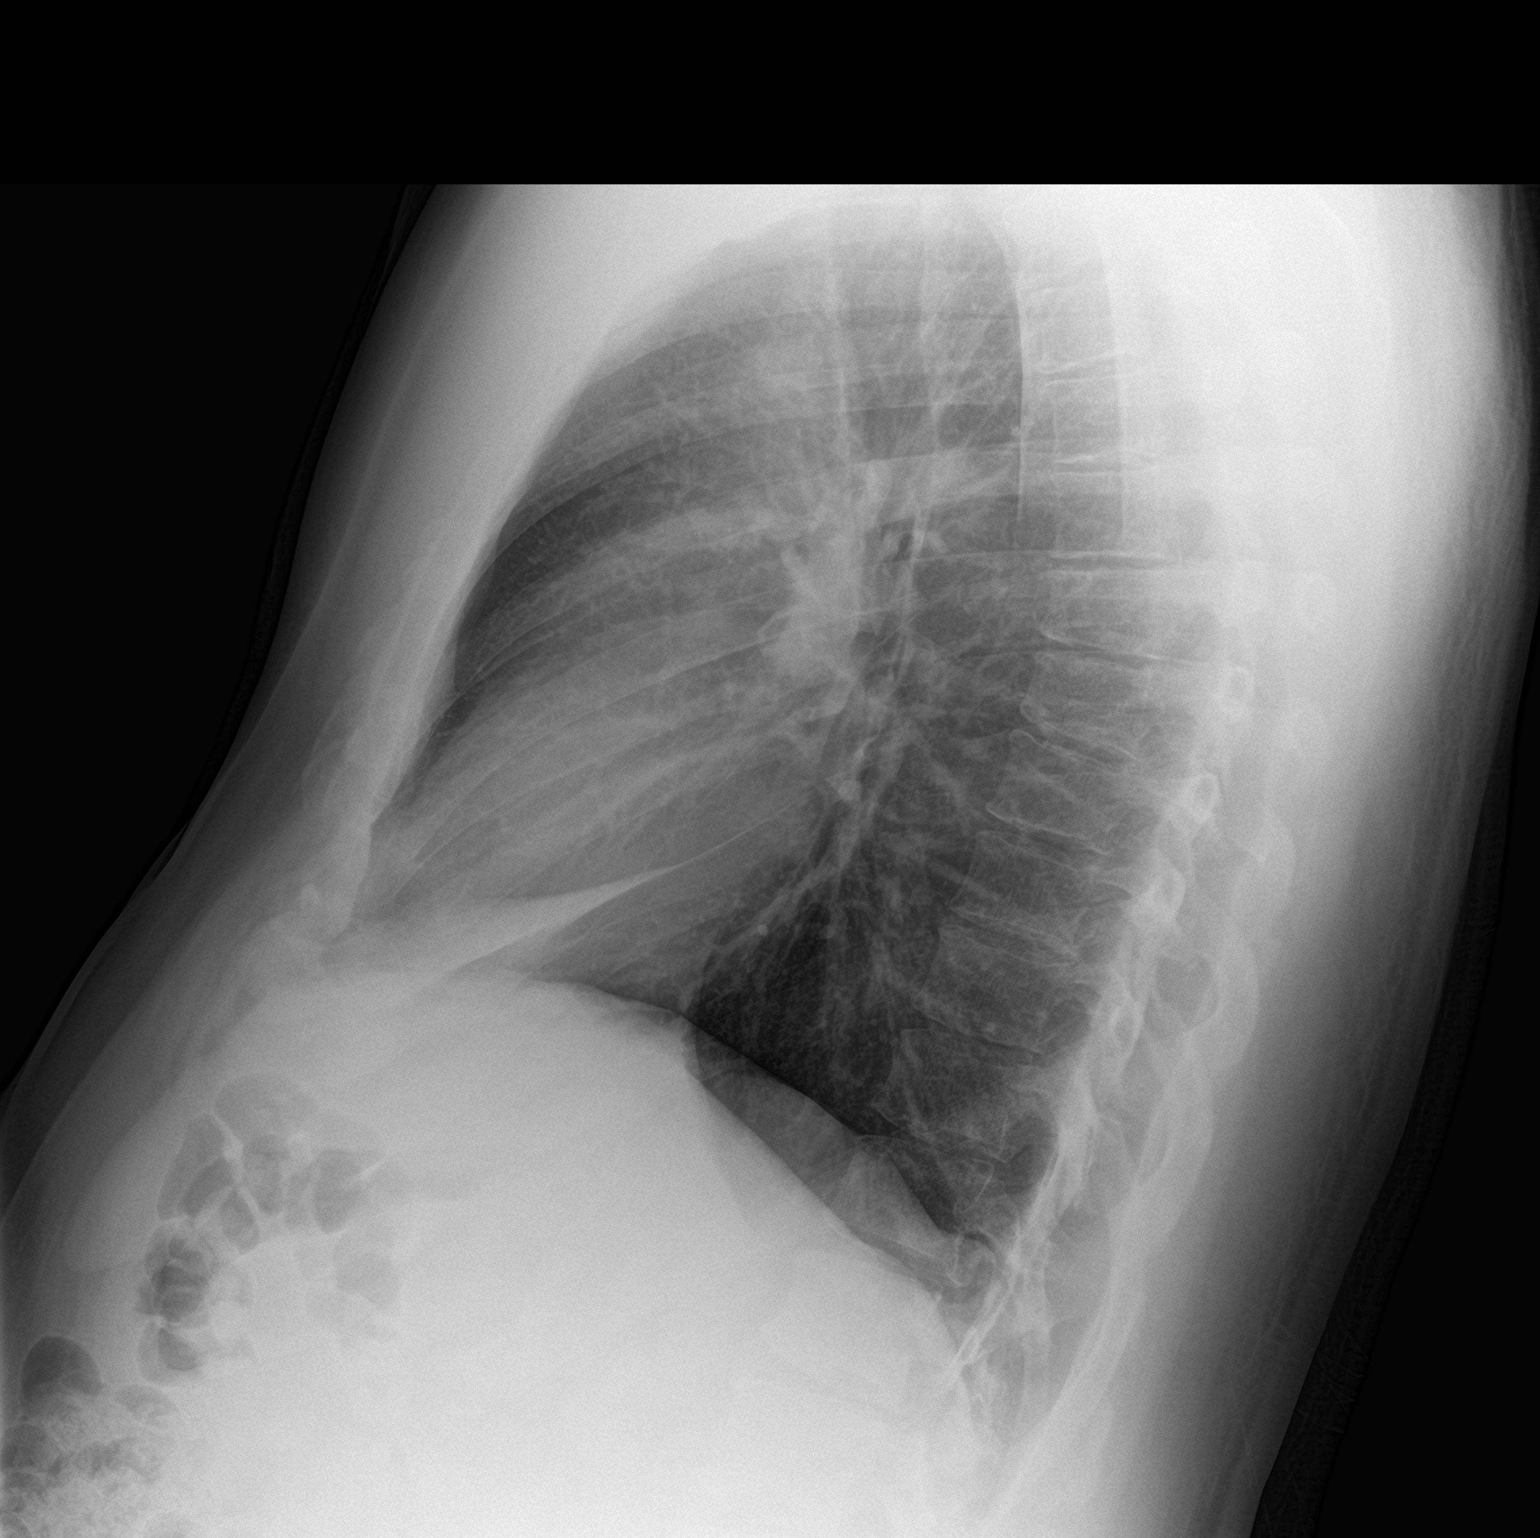

[2 of 2 positions shown; findings below may reference images not displayed]

FINDINGS: The heart size and mediastinal contours are within normal limits.
Both lungs are clear. No pneumothorax or pleural effusion is noted.
The visualized skeletal structures are unremarkable.
IMPRESSION: No active cardiopulmonary disease.

## 2021-06-13 ENCOUNTER — Other Ambulatory Visit (HOSPITAL_COMMUNITY): Payer: Self-pay

## 2021-06-15 ENCOUNTER — Other Ambulatory Visit (HOSPITAL_COMMUNITY): Payer: Self-pay

## 2021-06-21 ENCOUNTER — Other Ambulatory Visit (HOSPITAL_COMMUNITY): Payer: Self-pay

## 2021-06-25 ENCOUNTER — Other Ambulatory Visit (HOSPITAL_COMMUNITY): Payer: Self-pay

## 2021-06-25 ENCOUNTER — Other Ambulatory Visit: Payer: Self-pay | Admitting: Pharmacist

## 2021-06-25 DIAGNOSIS — B20 Human immunodeficiency virus [HIV] disease: Secondary | ICD-10-CM

## 2021-06-26 ENCOUNTER — Other Ambulatory Visit (HOSPITAL_COMMUNITY): Payer: Self-pay

## 2021-06-27 NOTE — Progress Notes (Deleted)
Office Visit Note  Patient: Mitchell Meyers             Date of Birth: [birth year]           MRN: [id number]             PCP: Leeanne Rio, MD Referring: Leeanne Rio, MD Visit Date: 07/10/2021 Occupation: @GUAROCC @  Subjective:  No chief complaint on file.   History of Present Illness: Mitchell Meyers is a 51 y.o. male ***   Activities of Daily Living:  Patient reports morning stiffness for *** {minute/hour:19697}.   Patient {ACTIONS;DENIES/REPORTS:21021675::"Denies"} nocturnal pain.  Difficulty dressing/grooming: {ACTIONS;DENIES/REPORTS:21021675::"Denies"} Difficulty climbing stairs: {ACTIONS;DENIES/REPORTS:21021675::"Denies"} Difficulty getting out of chair: {ACTIONS;DENIES/REPORTS:21021675::"Denies"} Difficulty using hands for taps, buttons, cutlery, and/or writing: {ACTIONS;DENIES/REPORTS:21021675::"Denies"}  No Rheumatology ROS completed.   PMFS History:  Patient Active Problem List   Diagnosis Date Noted   Asthmatic bronchitis , chronic ([place]) 09/15/2020   Epigastric burning sensation 02/28/2020   Screening for colorectal cancer 02/28/2020   Osteoarthritis 03/15/2019   Flu vaccine need 03/15/2019   Human immunodeficiency virus (HIV) disease ([place]) 08/25/2018    Past Medical History:  Diagnosis Date   HIV (human immunodeficiency virus infection) ([place])    Hypertension     Family History  Problem Relation Age of Onset   COPD Mother    Cancer Father    Arthritis Father    Alcohol abuse Father    Arthritis Sister    Bipolar disorder Sister    Hypertension Brother    Hepatitis C Brother    Healthy Daughter    Healthy Daughter    Past Surgical History:  Procedure Laterality Date   APPENDECTOMY  1999   Social History   Social History Narrative   Not on file   Immunization History  Administered Date(s) Administered   Influenza Inj Mdck Quad Pf 03/22/2020   Influenza,inj,Quad PF,6+ Mos 03/15/2019, 02/22/2021   Meningococcal Mcv4o 08/25/2018    Moderna Sars-Covid-2 Vaccination 10/29/2019, 11/26/2019, 06/17/2020   Pneumococcal Conjugate-13 08/25/2018     Objective: Vital Signs: There were no vitals taken for this visit.   Physical Exam   Musculoskeletal Exam: ***  CDAI Exam: CDAI Score: -- Patient Global: --; Provider Global: -- Swollen: --; Tender: -- Joint Exam 07/10/2021   No joint exam has been documented for this visit   There is currently no information documented on the homunculus. Go to the Rheumatology activity and complete the homunculus joint exam.  Investigation: No additional findings.  Imaging: No results found.  Recent Labs: Lab Results  Component Value Date   WBC 13.2 (H) 11/01/2020   HGB 13.9 11/01/2020   PLT 385 11/01/2020   NA 137 11/01/2020   K 4.2 11/01/2020   CL 103 11/01/2020   CO2 26 11/01/2020   GLUCOSE 259 (H) 11/01/2020   BUN 15 11/01/2020   CREATININE 0.88 11/01/2020   BILITOT 0.3 11/01/2020   AST 10 11/01/2020   ALT 13 11/01/2020   PROT 8.0 11/01/2020   CALCIUM 9.7 11/01/2020   GFRAA 89 04/08/2019   QFTBGOLDPLUS NEGATIVE 08/03/2018    Speciality Comments: No specialty comments available.  Procedures:  No procedures performed Allergies: Patient has no known allergies.   Assessment / Plan:     Visit Diagnoses: No diagnosis found.  Orders: No orders of the defined types were placed in this encounter.  No orders of the defined types were placed in this encounter.   Face-to-face time spent with patient was *** minutes. Greater than 50% of  time was spent in counseling and coordination of care.  Follow-Up Instructions: No follow-ups on file.   Earnestine Mealing, CMA  Note - This record has been created using Editor, commissioning.  Chart creation errors have been sought, but may not always  have been located. Such creation errors do not reflect on  the standard of medical care.

## 2021-07-04 ENCOUNTER — Ambulatory Visit: Payer: Medicaid Other | Admitting: Pharmacist

## 2021-07-10 ENCOUNTER — Ambulatory Visit: Payer: Medicaid Other | Admitting: Rheumatology

## 2021-07-10 DIAGNOSIS — Z79899 Other long term (current) drug therapy: Secondary | ICD-10-CM

## 2021-07-10 DIAGNOSIS — B20 Human immunodeficiency virus [HIV] disease: Secondary | ICD-10-CM

## 2021-07-10 DIAGNOSIS — M19042 Primary osteoarthritis, left hand: Secondary | ICD-10-CM

## 2021-07-10 DIAGNOSIS — F419 Anxiety disorder, unspecified: Secondary | ICD-10-CM

## 2021-07-10 DIAGNOSIS — M79641 Pain in right hand: Secondary | ICD-10-CM

## 2021-07-10 DIAGNOSIS — M2241 Chondromalacia patellae, right knee: Secondary | ICD-10-CM

## 2021-07-10 DIAGNOSIS — L4050 Arthropathic psoriasis, unspecified: Secondary | ICD-10-CM

## 2021-07-10 DIAGNOSIS — L409 Psoriasis, unspecified: Secondary | ICD-10-CM

## 2021-07-10 DIAGNOSIS — G8929 Other chronic pain: Secondary | ICD-10-CM

## 2021-08-02 ENCOUNTER — Other Ambulatory Visit (HOSPITAL_COMMUNITY): Payer: Self-pay

## 2021-08-06 ENCOUNTER — Other Ambulatory Visit (HOSPITAL_COMMUNITY): Payer: Self-pay

## 2021-08-14 ENCOUNTER — Ambulatory Visit: Payer: Medicaid Other | Admitting: Pharmacist

## 2021-08-17 ENCOUNTER — Other Ambulatory Visit (HOSPITAL_COMMUNITY): Payer: Self-pay

## 2021-08-23 ENCOUNTER — Other Ambulatory Visit: Payer: Self-pay

## 2021-08-23 ENCOUNTER — Ambulatory Visit (INDEPENDENT_AMBULATORY_CARE_PROVIDER_SITE_OTHER): Payer: Medicaid Other | Admitting: Pharmacist

## 2021-08-23 ENCOUNTER — Other Ambulatory Visit (HOSPITAL_COMMUNITY): Payer: Self-pay

## 2021-08-23 DIAGNOSIS — B20 Human immunodeficiency virus [HIV] disease: Secondary | ICD-10-CM | POA: Diagnosis not present

## 2021-08-23 DIAGNOSIS — Z113 Encounter for screening for infections with a predominantly sexual mode of transmission: Secondary | ICD-10-CM | POA: Diagnosis not present

## 2021-08-23 MED ORDER — BIKTARVY 50-200-25 MG PO TABS
1.0000 | ORAL_TABLET | Freq: Every day | ORAL | 2 refills | Status: DC
  Filled 2021-08-23 (×2): qty 30, 30d supply, fill #0

## 2021-08-23 MED ORDER — BICTEGRAVIR-EMTRICITAB-TENOFOV 50-200-25 MG PO TABS: 1.0000 | ORAL_TABLET | Freq: Every day | ORAL | 0 refills | Status: DC

## 2021-08-23 NOTE — Progress Notes (Signed)
? ?08/23/2021 ? ?HPI: Mitchell Meyers is a 51 y.o. male who presents to the [place] clinic for HIV follow-up. ? ?Patient Active Problem List  ? Diagnosis Date Noted  ? Asthmatic bronchitis , chronic ([place]) 09/15/2020  ? Epigastric burning sensation 02/28/2020  ? Screening for colorectal cancer 02/28/2020  ? Osteoarthritis 03/15/2019  ? Flu vaccine need 03/15/2019  ? Human immunodeficiency virus (HIV) disease ([place]) 08/25/2018  ? ? ?Patient's Medications  ?New Prescriptions  ? No medications on file  ?Previous Medications  ? ALBUTEROL (PROAIR HFA) 108 (90 BASE) MCG/ACT INHALER    2 puffs every 4 hours as needed only  if your can't catch your breath  ? ALBUTEROL (PROVENTIL) (2.5 MG/3ML) 0.083% NEBULIZER SOLUTION    Take 3 mLs (2.5 mg total) by nebulization every 6 (six) hours as needed for wheezing or shortness of breath.  ? BUDESONIDE-FORMOTEROL (SYMBICORT) 160-4.5 MCG/ACT INHALER    Take 2 puffs first thing in am and then another 2 puffs about 12 hours later.  ? CLOTRIMAZOLE (LOTRIMIN) 1 % CREAM    APPLY TOPICALLY 2 TIMES DAILY.  ? DICLOFENAC SODIUM (VOLTAREN) 1 % GEL    APPLY 2 GRAMS TOPICALLY 4 TIMES DAILY.  ? FLUTICASONE (FLONASE) 50 MCG/ACT NASAL SPRAY    Place 2 sprays into both nostrils in the morning and at bedtime.  ? LIDOCAINE (XYLOCAINE) 2 % SOLUTION    Use as directed 15 mLs in the mouth or throat as needed for mouth pain (Do NOT exceed 8 doses in a 24 hour period).  ? LORAZEPAM (ATIVAN) 2 MG TABLET    Take 2 mg by mouth at bedtime.  ? NAPROXEN (NAPROSYN) 500 MG TABLET    Take 1 tablet (500 mg total) by mouth 2 (two) times daily with a meal.  ? POLYETHYLENE GLYCOL-ELECTROLYTES (TRILYTE) 420 G SOLUTION    Take 4,000 mLs by mouth as directed.  ? POLYETHYLENE GLYCOL-ELECTROLYTES (TRILYTE) 420 G SOLUTION    Take 4,000 mLs by mouth as directed.  ? VALACYCLOVIR (VALTREX) 1000 MG TABLET    Take 1 tablet (1,000 mg total) by mouth 2 (two) times daily.  ? VITAMIN C (ASCORBIC ACID) 500 MG TABLET    Take 500 mg  by mouth daily.  ?Modified Medications  ? No medications on file  ?Discontinued Medications  ? DOLUTEGRAVIR (TIVICAY) 50 MG TABLET    Take 1 tablet (50 mg total) by mouth daily. Must be taken with Descovy (emtricitabine and tenofovir)  ? EMTRICITABINE-TENOFOVIR AF (DESCOVY) 200-25 MG TABLET    Take 1 tablet by mouth daily. Must be taken with Tivicay (dolutegravir).  ? ? ?Allergies: ?No Known Allergies ? ?Past Medical History: ?Past Medical History:  ?Diagnosis Date  ? HIV (human immunodeficiency virus infection) ([place])   ? Hypertension   ? ? ?Social History: ?Social History  ? ?Socioeconomic History  ? Marital status: Married  ?  Spouse name: Not on file  ? Number of children: Not on file  ? Years of education: Not on file  ? Highest education level: Not on file  ?Occupational History  ? Not on file  ?Tobacco Use  ? Smoking status: Former  ?  Types: Cigarettes  ?  Quit date: 2004  ?  Years since quitting: 19.2  ? Smokeless tobacco: Never  ? Tobacco comments:  ?  social  ?Vaping Use  ? Vaping Use: Never used  ?Substance and Sexual Activity  ? Alcohol use: Not Currently  ? Drug use: Not Currently  ? Sexual  activity: Not on file  ?Other Topics Concern  ? Not on file  ?Social History Narrative  ? Not on file  ? ?Social Determinants of Health  ? ?Financial Resource Strain: Not on file  ?Food Insecurity: Not on file  ?Transportation Needs: Not on file  ?Physical Activity: Not on file  ?Stress: Not on file  ?Social Connections: Not on file  ? ? ?Labs: ?Lab Results  ?Component Value Date  ? HIV1RNAQUANT 35 (H) 02/22/2021  ? HIV1RNAQUANT <20 (H) 11/01/2020  ? HIV1RNAQUANT <20 05/12/2020  ? CD4TABS 958 02/22/2021  ? CD4TABS 595 11/01/2020  ? CD4TABS 888 05/12/2020  ? ? ?RPR and STI ?Lab Results  ?Component Value Date  ? LABRPR NON-REACTIVE 02/22/2021  ? LABRPR NON-REACTIVE 11/01/2020  ? LABRPR NON-REACTIVE 12/21/2019  ? LABRPR NON-REACTIVE 08/03/2018  ? ? ?STI Results GC CT  ?11/01/2020 Negative Negative  ?12/21/2019 Negative  Negative  ?08/03/2018 Negative Negative  ? ? ?Hepatitis B ?Lab Results  ?Component Value Date  ? HEPBSAB REACTIVE (A) 08/03/2018  ? HEPBSAG NON-REACTIVE 08/03/2018  ? HEPBCAB NON-REACTIVE 08/03/2018  ? ?Hepatitis C ?Lab Results  ?Component Value Date  ? HEPCAB NON-REACTIVE 08/03/2018  ? ?Hepatitis A ?Lab Results  ?Component Value Date  ? HAV REACTIVE (A) 08/03/2018  ? ?Lipids: ?Lab Results  ?Component Value Date  ? CHOL 221 (H) 11/01/2020  ? TRIG 118 11/01/2020  ? HDL 38 (L) 11/01/2020  ? CHOLHDL 5.8 (H) 11/01/2020  ? LDLCALC 159 (H) 11/01/2020  ? ? ?Current HIV Regimen: ?Tivicay & Descovy  ? ?Assessment: ?Mitchell Meyers presents to clinic today for HIV follow-up. He has missed many appointments and was last seen by Alfonse Spruce, PharmD on 02/22/22. At that visit, it was discovered that he was taking Tivicay as monotherapy. He states that a friend in [place] was on the same regimen and she had stopped taking Descovy so he started doing the same. Since his viral load has been undetectable, he is not sure why monotherapy Tivicay was problematic. We discussed again how this is not recommended as it is not a complete regimen. 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 ? ?When asked today what regimen he is taking, he endorses he takes both Tivicay and Descovy, but he has been out of medicine for 2 months. He is agreeable that less pill burden could be beneficial. We had a discussion in depth at what his options are (i.e., Biktarvy vs. Triumeq vs. Dovato) and he was amenable to trying Biktarvy. Sent Biktarvy prescription to [facility]. Will obtain HIV RNA and CD4 today.  ? ?One of his major motivators to switch regimens is his worsening arthritis, especially in his hands. Explained to patient there is a possibility of improved inflammation when he returns to a full regimen.  ? ?His sexual preferences include women. He denies any new partners since his last visit but is agreeable to RPR  and oral/urine cytologies.  ? ?Plan: ?- Stop Descovy and Tivicay ?- Start Biktarvy (prescription sent to [facility]) ?- HIV RNA and CD4 ?- RPR, oral/urine cytologies ?- F/up with Estill Bamberg 09/27/21 at 1:45 PM ? ?Joseph Art, Pharm.D. ?PGY-1 Pharmacy Resident ?08/23/2021 2:19 PM ?

## 2021-08-24 LAB — GC/CHLAMYDIA PROBE, AMP (THROAT)
Chlamydia trachomatis RNA: NOT DETECTED
Neisseria gonorrhoeae RNA: NOT DETECTED

## 2021-08-24 LAB — C. TRACHOMATIS/N. GONORRHOEAE RNA
C. trachomatis RNA, TMA: NOT DETECTED
N. gonorrhoeae RNA, TMA: NOT DETECTED

## 2021-08-29 LAB — T-HELPER CELLS (CD4) COUNT (NOT AT ARMC)
Absolute CD4: 885 cells/uL (ref 490–1740)
CD4 T Helper %: 49 % (ref 30–61)
Total lymphocyte count: 1819 cells/uL (ref 850–3900)

## 2021-08-29 LAB — HIV RNA, RTPCR W/R GT (RTI, PI,INT)
HIV 1 RNA Quant: NOT DETECTED copies/mL
HIV-1 RNA Quant, Log: NOT DETECTED Log copies/mL

## 2021-08-29 LAB — RPR: RPR Ser Ql: NONREACTIVE

## 2021-09-13 ENCOUNTER — Other Ambulatory Visit (HOSPITAL_COMMUNITY): Payer: Self-pay

## 2021-09-17 ENCOUNTER — Other Ambulatory Visit (HOSPITAL_COMMUNITY): Payer: Self-pay

## 2021-09-17 ENCOUNTER — Encounter: Payer: Self-pay | Admitting: Pharmacist

## 2021-09-17 ENCOUNTER — Telehealth: Payer: Self-pay

## 2021-09-17 ENCOUNTER — Other Ambulatory Visit: Payer: Self-pay | Admitting: Pharmacist

## 2021-09-17 DIAGNOSIS — B20 Human immunodeficiency virus [HIV] disease: Secondary | ICD-10-CM

## 2021-09-17 MED ORDER — DESCOVY 200-25 MG PO TABS
1.0000 | ORAL_TABLET | Freq: Every day | ORAL | 3 refills | Status: DC
  Filled 2021-09-17 (×2): qty 30, 30d supply, fill #0
  Filled 2021-10-08: qty 30, 30d supply, fill #1
  Filled 2021-11-06: qty 30, 30d supply, fill #2
  Filled 2021-11-07 – 2021-11-29 (×2): qty 30, 30d supply, fill #3

## 2021-09-17 MED ORDER — TIVICAY 50 MG PO TABS
50.0000 mg | ORAL_TABLET | Freq: Every day | ORAL | 3 refills | Status: DC
  Filled 2021-09-17 (×2): qty 30, 30d supply, fill #0
  Filled 2021-10-08: qty 30, 30d supply, fill #1
  Filled 2021-11-06: qty 30, 30d supply, fill #2
  Filled 2021-11-07 – 2021-11-29 (×2): qty 30, 30d supply, fill #3

## 2021-09-17 NOTE — Telephone Encounter (Signed)
Called patient regarding mychart message; ?  2 weeks ago i was preacribed a different HIV med. Since switching all i've gotten are headache and throwing up. 80% of the time. I'm requesting i want my old med back. Tivicay & Descovy. I will stop using the new ones ? ?Forwarded message to Marchelle Folks, Pharmacist who will look into patient concern.  ?Spoke with patient and requested medication be sent to [facility]. ?Juanita Laster, RMA ? ?

## 2021-09-17 NOTE — Telephone Encounter (Signed)
Sent patient MyChart message regarding therapy change. Will await response and follow-up with patient on 4/20. ? ?Dovato would be an appropriate option for him given he remains undetectable and is HBV negative/immunized. ? ?Alfonse Spruce, PharmD, CPP ?Clinical Pharmacist Practitioner ?Infectious Diseases Clinical Pharmacist ?[facility] ? ?

## 2021-09-27 ENCOUNTER — Ambulatory Visit: Payer: Medicaid Other | Admitting: Pharmacist

## 2021-10-04 ENCOUNTER — Telehealth: Payer: Self-pay | Admitting: Internal Medicine

## 2021-10-04 NOTE — Telephone Encounter (Signed)
Dr. Marina Goodell, ? ?Referral received from PCP on 09/18/2021 am (DOD) for stomach issues.  Patient was seen at [facility] 01/2021 and stated "I do not want to go back there, I would like to come to [facility] because my doctor recommended [facility]"  Records are in Epic from [facility].  Can you please review and advise on scheduling?  Thank you. ?

## 2021-10-04 NOTE — Telephone Encounter (Signed)
Request received to transfer GI care from outside practice to [facility].  We appreciate the interest in our practice, however at this time due to high demand from patients without established GI providers we cannot accommodate this transfer.  Ability to accommodate future transfer requests may change over time and the patient can contact us again in 6-12 months if still interested in being seen at [facility].      °

## 2021-10-08 ENCOUNTER — Other Ambulatory Visit (HOSPITAL_COMMUNITY): Payer: Self-pay

## 2021-10-11 ENCOUNTER — Other Ambulatory Visit (HOSPITAL_COMMUNITY): Payer: Self-pay

## 2021-10-19 NOTE — Telephone Encounter (Signed)
FYI - please advise. Thank you, Marchelle Folks

## 2021-10-26 ENCOUNTER — Ambulatory Visit: Payer: Medicaid Other | Admitting: Pharmacist

## 2021-11-01 ENCOUNTER — Ambulatory Visit: Payer: Medicaid Other | Admitting: Internal Medicine

## 2021-11-01 ENCOUNTER — Other Ambulatory Visit (HOSPITAL_COMMUNITY): Payer: Self-pay

## 2021-11-06 ENCOUNTER — Other Ambulatory Visit (HOSPITAL_COMMUNITY): Payer: Self-pay

## 2021-11-07 ENCOUNTER — Other Ambulatory Visit (HOSPITAL_COMMUNITY): Payer: Self-pay

## 2021-11-09 ENCOUNTER — Encounter: Payer: Self-pay | Admitting: Internal Medicine

## 2021-11-09 ENCOUNTER — Ambulatory Visit: Payer: Medicaid Other | Admitting: Internal Medicine

## 2021-11-09 ENCOUNTER — Other Ambulatory Visit: Payer: Self-pay

## 2021-11-09 VITALS — BP 144/94 | HR 88 | Temp 97.7°F | Ht 69.0 in | Wt 228.0 lb

## 2021-11-09 DIAGNOSIS — B350 Tinea barbae and tinea capitis: Secondary | ICD-10-CM | POA: Diagnosis not present

## 2021-11-09 DIAGNOSIS — B20 Human immunodeficiency virus [HIV] disease: Secondary | ICD-10-CM | POA: Diagnosis present

## 2021-11-09 MED ORDER — CLOTRIMAZOLE 1 % EX CREA: 1.0000 "application " | TOPICAL_CREAM | Freq: Two times a day (BID) | CUTANEOUS | 1 refills | Status: DC

## 2021-11-09 NOTE — Assessment & Plan Note (Signed)
Will check his labs today He will continue with follow up in 4 months.

## 2021-11-09 NOTE — Assessment & Plan Note (Signed)
New issue.  Appearance of tinea.  Will prescribe topical treatment.

## 2021-11-09 NOTE — Progress Notes (Signed)
   Subjective:    Patient ID: Mitchell Meyers, male    DOB: Mar 16, 1971, 51 y.o.   MRN: [id number]  HPI Here for a work in visit with a rash on his scalp.   Never had this before.  Two patches on his scalp.  Scratches them.     Review of Systems  Constitutional:  Negative for fever.      Objective:   Physical Exam HENT:     Head:     Comments: Two areas noted of a patchy, dry area         Assessment & Plan:

## 2021-11-12 LAB — COMPLETE METABOLIC PANEL WITH GFR
AG Ratio: 0.8 (calc) — ABNORMAL LOW (ref 1.0–2.5)
ALT: 13 U/L (ref 9–46)
AST: 11 U/L (ref 10–35)
Albumin: 3.9 g/dL (ref 3.6–5.1)
Alkaline phosphatase (APISO): 64 U/L (ref 35–144)
BUN: 19 mg/dL (ref 7–25)
CO2: 27 mmol/L (ref 20–32)
Calcium: 9.5 mg/dL (ref 8.6–10.3)
Chloride: 102 mmol/L (ref 98–110)
Creat: 1.08 mg/dL (ref 0.70–1.30)
Globulin: 5 g/dL (calc) — ABNORMAL HIGH (ref 1.9–3.7)
Glucose, Bld: 139 mg/dL — ABNORMAL HIGH (ref 65–99)
Potassium: 4.1 mmol/L (ref 3.5–5.3)
Sodium: 139 mmol/L (ref 135–146)
Total Bilirubin: 0.4 mg/dL (ref 0.2–1.2)
Total Protein: 8.9 g/dL — ABNORMAL HIGH (ref 6.1–8.1)
eGFR: 83 mL/min/{1.73_m2} (ref >=60)

## 2021-11-12 LAB — T-HELPER CELLS (CD4) COUNT (NOT AT ARMC)
Absolute CD4: 1259 cells/uL (ref 490–1740)
CD4 T Helper %: 49 % (ref 30–61)
Total lymphocyte count: 2568 cells/uL (ref 850–3900)

## 2021-11-12 LAB — HIV-1 RNA QUANT-NO REFLEX-BLD
HIV 1 RNA Quant: NOT DETECTED Copies/mL
HIV-1 RNA Quant, Log: NOT DETECTED Log cps/mL

## 2021-11-20 ENCOUNTER — Other Ambulatory Visit: Payer: Self-pay | Admitting: Internal Medicine

## 2021-11-20 DIAGNOSIS — R21 Rash and other nonspecific skin eruption: Secondary | ICD-10-CM

## 2021-11-21 ENCOUNTER — Encounter: Payer: Self-pay | Admitting: Internal Medicine

## 2021-11-21 ENCOUNTER — Other Ambulatory Visit: Payer: Self-pay

## 2021-11-21 MED ORDER — CLOTRIMAZOLE 1 % EX CREA: 1.0000 "application " | TOPICAL_CREAM | Freq: Two times a day (BID) | CUTANEOUS | 1 refills | Status: AC

## 2021-11-26 ENCOUNTER — Other Ambulatory Visit (HOSPITAL_COMMUNITY): Payer: Self-pay

## 2021-11-29 ENCOUNTER — Other Ambulatory Visit (HOSPITAL_COMMUNITY): Payer: Self-pay

## 2021-12-03 ENCOUNTER — Other Ambulatory Visit (HOSPITAL_COMMUNITY): Payer: Self-pay

## 2021-12-05 ENCOUNTER — Encounter: Payer: Self-pay | Admitting: Internal Medicine

## 2021-12-24 ENCOUNTER — Other Ambulatory Visit (HOSPITAL_COMMUNITY): Payer: Self-pay

## 2021-12-24 ENCOUNTER — Other Ambulatory Visit: Payer: Self-pay | Admitting: Pharmacist

## 2021-12-24 DIAGNOSIS — B20 Human immunodeficiency virus [HIV] disease: Secondary | ICD-10-CM

## 2021-12-24 MED ORDER — DESCOVY 200-25 MG PO TABS
1.0000 | ORAL_TABLET | Freq: Every day | ORAL | 3 refills | Status: DC
  Filled 2021-12-24: qty 30, 30d supply, fill #0
  Filled 2022-01-18: qty 30, 30d supply, fill #1
  Filled 2022-02-18 – 2022-02-22 (×2): qty 30, 30d supply, fill #2

## 2021-12-24 MED ORDER — TIVICAY 50 MG PO TABS
50.0000 mg | ORAL_TABLET | Freq: Every day | ORAL | 3 refills | Status: DC
  Filled 2021-12-24: qty 30, 30d supply, fill #0
  Filled 2022-02-18: qty 30, 30d supply, fill #1

## 2021-12-25 ENCOUNTER — Other Ambulatory Visit (HOSPITAL_COMMUNITY): Payer: Self-pay

## 2021-12-27 ENCOUNTER — Other Ambulatory Visit (HOSPITAL_COMMUNITY): Payer: Self-pay

## 2022-01-18 ENCOUNTER — Other Ambulatory Visit (HOSPITAL_COMMUNITY): Payer: Self-pay

## 2022-01-24 ENCOUNTER — Other Ambulatory Visit (HOSPITAL_COMMUNITY): Payer: Self-pay

## 2022-02-13 ENCOUNTER — Other Ambulatory Visit (HOSPITAL_COMMUNITY): Payer: Self-pay

## 2022-02-18 ENCOUNTER — Other Ambulatory Visit (HOSPITAL_COMMUNITY): Payer: Self-pay

## 2022-02-19 ENCOUNTER — Other Ambulatory Visit (HOSPITAL_COMMUNITY): Payer: Self-pay

## 2022-02-20 ENCOUNTER — Other Ambulatory Visit (HOSPITAL_COMMUNITY): Payer: Self-pay

## 2022-02-21 ENCOUNTER — Other Ambulatory Visit (HOSPITAL_COMMUNITY): Payer: Self-pay

## 2022-02-22 ENCOUNTER — Other Ambulatory Visit (HOSPITAL_COMMUNITY): Payer: Self-pay

## 2022-02-25 ENCOUNTER — Other Ambulatory Visit (HOSPITAL_COMMUNITY): Payer: Self-pay

## 2022-02-26 ENCOUNTER — Encounter: Payer: Self-pay | Admitting: Internal Medicine

## 2022-03-04 ENCOUNTER — Encounter: Payer: Self-pay | Admitting: Internal Medicine

## 2022-03-04 ENCOUNTER — Other Ambulatory Visit: Payer: Self-pay

## 2022-03-04 DIAGNOSIS — B20 Human immunodeficiency virus [HIV] disease: Secondary | ICD-10-CM

## 2022-03-04 MED ORDER — DESCOVY 200-25 MG PO TABS: 1.0000 | ORAL_TABLET | Freq: Every day | ORAL | 0 refills | Status: DC

## 2022-03-04 MED ORDER — TIVICAY 50 MG PO TABS: 50.0000 mg | ORAL_TABLET | Freq: Every day | ORAL | 0 refills | Status: DC

## 2022-03-07 ENCOUNTER — Other Ambulatory Visit (HOSPITAL_COMMUNITY): Payer: Self-pay

## 2022-03-13 ENCOUNTER — Ambulatory Visit: Payer: Self-pay | Admitting: Internal Medicine

## 2022-03-13 ENCOUNTER — Other Ambulatory Visit: Payer: Self-pay

## 2022-03-13 ENCOUNTER — Ambulatory Visit: Payer: Medicaid Other | Admitting: Internal Medicine

## 2022-03-13 ENCOUNTER — Encounter: Payer: Self-pay | Admitting: Internal Medicine

## 2022-03-13 VITALS — BP 137/87 | HR 88 | Temp 98.8°F | Ht 69.0 in | Wt 221.0 lb

## 2022-03-13 DIAGNOSIS — B20 Human immunodeficiency virus [HIV] disease: Secondary | ICD-10-CM

## 2022-03-13 DIAGNOSIS — Z23 Encounter for immunization: Secondary | ICD-10-CM

## 2022-03-13 DIAGNOSIS — B350 Tinea barbae and tinea capitis: Secondary | ICD-10-CM

## 2022-03-13 MED ORDER — NAPROXEN 500 MG PO TABS: 500.0000 mg | ORAL_TABLET | Freq: Two times a day (BID) | ORAL | 5 refills | Status: DC

## 2022-03-13 MED ORDER — VALACYCLOVIR HCL 1 G PO TABS: 1000.0000 mg | ORAL_TABLET | Freq: Two times a day (BID) | ORAL | 10 refills | Status: DC

## 2022-03-13 MED ORDER — VALACYCLOVIR HCL 1 G PO TABS: 1000.0000 mg | ORAL_TABLET | Freq: Two times a day (BID) | ORAL | 10 refills | Status: AC

## 2022-03-13 MED ORDER — TIVICAY 50 MG PO TABS: 50.0000 mg | ORAL_TABLET | Freq: Every day | ORAL | 11 refills | Status: DC

## 2022-03-13 MED ORDER — FLUCONAZOLE 200 MG PO TABS: 200.0000 mg | ORAL_TABLET | Freq: Every day | ORAL | 0 refills | Status: AC

## 2022-03-13 MED ORDER — FLUCONAZOLE 200 MG PO TABS
200.0000 mg | ORAL_TABLET | Freq: Every day | ORAL | 0 refills | Status: DC
Start: 2022-03-13 — End: 2022-03-13

## 2022-03-13 MED ORDER — NAPROXEN 500 MG PO TABS: 500.0000 mg | ORAL_TABLET | Freq: Two times a day (BID) | ORAL | 5 refills | Status: AC

## 2022-03-13 MED ORDER — DESCOVY 200-25 MG PO TABS: 1.0000 | ORAL_TABLET | Freq: Every day | ORAL | 11 refills | Status: DC

## 2022-03-13 NOTE — Assessment & Plan Note (Signed)
I will try oral fluconazole to see if that helps.

## 2022-03-13 NOTE — Assessment & Plan Note (Signed)
He received his flu shot from his PCP

## 2022-03-13 NOTE — Assessment & Plan Note (Signed)
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   Will get labs today and he can return in 6 months.

## 2022-03-13 NOTE — Progress Notes (Signed)
   Subjective:    Patient ID: Mitchell Meyers, male    DOB: 1971-01-12, 51 y.o.   MRN: [id number]  HPI Here for follow up of HIV He continues on Tivicay montherapy for his controlled virus c/w an elite controller.  CD4 has remained controlled and last was 1259.   Previously seen for a rash on his scalp, improved but still there.   No issues today.    Review of Systems  Constitutional:  Negative for fatigue.  Gastrointestinal:  Negative for diarrhea.  Skin:  Negative for rash.       Objective:   Physical Exam Eyes:     General: No scleral icterus. Pulmonary:     Effort: Pulmonary effort is normal.  Neurological:     Mental Status: He is alert.   SH: previous smoker        Assessment & Plan:

## 2022-03-14 ENCOUNTER — Encounter: Payer: Self-pay | Admitting: Internal Medicine

## 2022-03-14 LAB — T-HELPER CELL (CD4) - (RCID CLINIC ONLY)
CD4 % Helper T Cell: 47 % (ref 33–65)
CD4 T Cell Abs: 793 /uL (ref 400–1790)

## 2022-03-16 LAB — CBC WITH DIFFERENTIAL/PLATELET
Absolute Monocytes: 595 cells/uL (ref 200–950)
Basophils Absolute: 28 cells/uL (ref 0–200)
Basophils Relative: 0.3 %
Eosinophils Absolute: 251 cells/uL (ref 15–500)
Eosinophils Relative: 2.7 %
HCT: 38.5 % (ref 38.5–50.0)
Hemoglobin: 13 g/dL — ABNORMAL LOW (ref 13.2–17.1)
Lymphs Abs: 1804 cells/uL (ref 850–3900)
MCH: 29.5 pg (ref 27.0–33.0)
MCHC: 33.8 g/dL (ref 32.0–36.0)
MCV: 87.3 fL (ref 80.0–100.0)
MPV: 9.6 fL (ref 7.5–12.5)
Monocytes Relative: 6.4 %
Neutro Abs: 6622 cells/uL (ref 1500–7800)
Neutrophils Relative %: 71.2 %
Platelets: 393 10*3/uL (ref 140–400)
RBC: 4.41 10*6/uL (ref 4.20–5.80)
RDW: 13 % (ref 11.0–15.0)
Total Lymphocyte: 19.4 %
WBC: 9.3 10*3/uL (ref 3.8–10.8)

## 2022-03-16 LAB — COMPLETE METABOLIC PANEL WITH GFR
AG Ratio: 0.9 (calc) — ABNORMAL LOW (ref 1.0–2.5)
ALT: 12 U/L (ref 9–46)
AST: 13 U/L (ref 10–35)
Albumin: 4.1 g/dL (ref 3.6–5.1)
Alkaline phosphatase (APISO): 75 U/L (ref 35–144)
BUN: 18 mg/dL (ref 7–25)
CO2: 27 mmol/L (ref 20–32)
Calcium: 9.6 mg/dL (ref 8.6–10.3)
Chloride: 104 mmol/L (ref 98–110)
Creat: 1.01 mg/dL (ref 0.70–1.30)
Globulin: 4.6 g/dL (calc) — ABNORMAL HIGH (ref 1.9–3.7)
Glucose, Bld: 121 mg/dL — ABNORMAL HIGH (ref 65–99)
Potassium: 4 mmol/L (ref 3.5–5.3)
Sodium: 140 mmol/L (ref 135–146)
Total Bilirubin: 0.4 mg/dL (ref 0.2–1.2)
Total Protein: 8.7 g/dL — ABNORMAL HIGH (ref 6.1–8.1)
eGFR: 90 mL/min/{1.73_m2} (ref >=60)

## 2022-03-16 LAB — HIV-1 RNA QUANT-NO REFLEX-BLD
HIV 1 RNA Quant: NOT DETECTED Copies/mL
HIV-1 RNA Quant, Log: NOT DETECTED Log cps/mL

## 2022-03-25 ENCOUNTER — Encounter: Payer: Self-pay | Admitting: Internal Medicine

## 2022-06-18 ENCOUNTER — Other Ambulatory Visit (HOSPITAL_COMMUNITY): Payer: Self-pay

## 2022-06-28 ENCOUNTER — Encounter: Payer: Self-pay | Admitting: Internal Medicine

## 2022-08-06 ENCOUNTER — Telehealth: Payer: Self-pay | Admitting: Rheumatology

## 2022-08-06 NOTE — Telephone Encounter (Signed)
Yes, patient may see Lovena Le.

## 2022-08-06 NOTE — Telephone Encounter (Signed)
Patient called the office to schedule an appointment for a flare that wont go away. Patient is scheduled for April which is Dr. Arlean Hopping next available. Patient would like to be seen as soon as possible, can he be scheduled with Lovena Le? Please advise.

## 2022-08-06 NOTE — Progress Notes (Deleted)
Office Visit Note  Patient: Mitchell Meyers             Date of Birth: 1971-01-04           MRN: [id number]             PCP: Leeanne Rio, MD Referring: Leeanne Rio, MD Visit Date: 08/07/2022 Occupation: '@GUAROCC'$ @  Subjective:    History of Present Illness: Mitchell Meyers is a 52 y.o. male with history of psoriatic arthritis and osteoarthritis.  Patient is not currently taking any immunosuppressive agents.  He presents today experiencing a flare.   Patient was last seen in the office on 06/22/20.      Activities of Daily Living:  Patient reports morning stiffness for *** {minute/hour:19697}.   Patient {ACTIONS;DENIES/REPORTS:21021675::"Denies"} nocturnal pain.  Difficulty dressing/grooming: {ACTIONS;DENIES/REPORTS:21021675::"Denies"} Difficulty climbing stairs: {ACTIONS;DENIES/REPORTS:21021675::"Denies"} Difficulty getting out of chair: {ACTIONS;DENIES/REPORTS:21021675::"Denies"} Difficulty using hands for taps, buttons, cutlery, and/or writing: {ACTIONS;DENIES/REPORTS:21021675::"Denies"}  No Rheumatology ROS completed.   PMFS History:  Patient Active Problem List   Diagnosis Date Noted   Tinea capitis 11/09/2021   Asthmatic bronchitis , chronic 09/15/2020   Epigastric burning sensation 02/28/2020   Screening for colorectal cancer 02/28/2020   Osteoarthritis 03/15/2019   Flu vaccine need 03/15/2019   Human immunodeficiency virus (HIV) disease ([place]) 08/25/2018    Past Medical History:  Diagnosis Date   HIV (human immunodeficiency virus infection) ([place])    Hypertension     Family History  Problem Relation Age of Onset   COPD Mother    Cancer Father    Arthritis Father    Alcohol abuse Father    Arthritis Sister    Bipolar disorder Sister    Hypertension Brother    Hepatitis C Brother    Healthy Daughter    Healthy Daughter    Past Surgical History:  Procedure Laterality Date   APPENDECTOMY  1999   Social History   Social History Narrative    Not on file   Immunization History  Administered Date(s) Administered   Influenza Inj Mdck Quad Pf 03/22/2020   Influenza,inj,Quad PF,6+ Mos 03/15/2019, 02/22/2021   Meningococcal Mcv4o 08/25/2018   Moderna Sars-Covid-2 Vaccination 10/29/2019, 11/26/2019, 06/17/2020   Pneumococcal Conjugate-13 08/25/2018   Pneumococcal Polysaccharide-23 05/30/2021   Tdap 07/10/2018     Objective: Vital Signs: There were no vitals taken for this visit.   Physical Exam Vitals and nursing note reviewed.  Constitutional:      Appearance: He is well-developed.  HENT:     Head: Normocephalic and atraumatic.  Eyes:     Conjunctiva/sclera: Conjunctivae normal.     Pupils: Pupils are equal, round, and reactive to light.  Cardiovascular:     Rate and Rhythm: Normal rate and regular rhythm.     Heart sounds: Normal heart sounds.  Pulmonary:     Effort: Pulmonary effort is normal.     Breath sounds: Normal breath sounds.  Abdominal:     General: Bowel sounds are normal.     Palpations: Abdomen is soft.  Musculoskeletal:     Cervical back: Normal range of motion and neck supple.  Skin:    General: Skin is warm and dry.     Capillary Refill: Capillary refill takes less than 2 seconds.  Neurological:     Mental Status: He is alert and oriented to person, place, and time.  Psychiatric:        Behavior: Behavior normal.      Musculoskeletal Exam: ***  CDAI Exam: CDAI Score: --  Patient Global: --; Provider Global: -- Swollen: --; Tender: -- Joint Exam 08/07/2022   No joint exam has been documented for this visit   There is currently no information documented on the homunculus. Go to the Rheumatology activity and complete the homunculus joint exam.  Investigation: No additional findings.  Imaging: No results found.  Recent Labs: Lab Results  Component Value Date   WBC 9.3 03/13/2022   HGB 13.0 (L) 03/13/2022   PLT 393 03/13/2022   NA 140 03/13/2022   K 4.0 03/13/2022   CL 104  03/13/2022   CO2 27 03/13/2022   GLUCOSE 121 (H) 03/13/2022   BUN 18 03/13/2022   CREATININE 1.01 03/13/2022   BILITOT 0.4 03/13/2022   AST 13 03/13/2022   ALT 12 03/13/2022   PROT 8.7 (H) 03/13/2022   CALCIUM 9.6 03/13/2022   GFRAA 89 04/08/2019   QFTBGOLDPLUS NEGATIVE 08/03/2018    Speciality Comments: No specialty comments available.  Procedures:  No procedures performed Allergies: Patient has no known allergies.   Assessment / Plan:     Visit Diagnoses: Psoriatic arthritis ([place]) - diagnosed with psoriatic arthritis in [place]. Established care with Dr. Keturah Barre in 2020. Infrequent follow up.  Psoriasis  High risk medication use - Naprosyn. Hx of HIV. Inadequate response to MTX in 2018.  Discussed Otelza-lost follow up.  Primary osteoarthritis of both hands - X-ray shows old erosive changes and osteoarthritic changes but no radiographic progression.  Chondromalacia of both patellae - Mild  Chronic SI joint pain - Referred to PT  History of HIV infection ([place]) - diagnosed in 2002 and has been under care of Dr. Novella Olive.  Insomnia secondary to anxiety  Orders: No orders of the defined types were placed in this encounter.  No orders of the defined types were placed in this encounter.   Face-to-face time spent with patient was *** minutes. Greater than 50% of time was spent in counseling and coordination of care.  Follow-Up Instructions: No follow-ups on file.   Ofilia Neas, PA-C  Note - This record has been created using Dragon software.  Chart creation errors have been sought, but may not always  have been located. Such creation errors do not reflect on  the standard of medical care.

## 2022-08-07 ENCOUNTER — Ambulatory Visit: Payer: Medicaid Other | Admitting: Physician Assistant

## 2022-08-07 DIAGNOSIS — F419 Anxiety disorder, unspecified: Secondary | ICD-10-CM

## 2022-08-07 DIAGNOSIS — B20 Human immunodeficiency virus [HIV] disease: Secondary | ICD-10-CM

## 2022-08-07 DIAGNOSIS — L4050 Arthropathic psoriasis, unspecified: Secondary | ICD-10-CM

## 2022-08-07 DIAGNOSIS — G8929 Other chronic pain: Secondary | ICD-10-CM

## 2022-08-07 DIAGNOSIS — L409 Psoriasis, unspecified: Secondary | ICD-10-CM

## 2022-08-07 DIAGNOSIS — M2241 Chondromalacia patellae, right knee: Secondary | ICD-10-CM

## 2022-08-07 DIAGNOSIS — M19042 Primary osteoarthritis, left hand: Secondary | ICD-10-CM

## 2022-08-07 DIAGNOSIS — Z79899 Other long term (current) drug therapy: Secondary | ICD-10-CM

## 2022-08-08 ENCOUNTER — Ambulatory Visit (INDEPENDENT_AMBULATORY_CARE_PROVIDER_SITE_OTHER): Payer: Medicaid Other

## 2022-08-08 ENCOUNTER — Ambulatory Visit (INDEPENDENT_AMBULATORY_CARE_PROVIDER_SITE_OTHER): Payer: Medicaid Other | Admitting: Orthopaedic Surgery

## 2022-08-08 DIAGNOSIS — M7582 Other shoulder lesions, left shoulder: Secondary | ICD-10-CM | POA: Diagnosis not present

## 2022-08-08 DIAGNOSIS — M25512 Pain in left shoulder: Secondary | ICD-10-CM | POA: Diagnosis not present

## 2022-08-08 DIAGNOSIS — G8929 Other chronic pain: Secondary | ICD-10-CM

## 2022-08-08 MED ORDER — TRIAMCINOLONE ACETONIDE 40 MG/ML IJ SUSP
80.0000 mg | INTRAMUSCULAR | Status: AC | PRN
  Administered 2022-08-08: 80 mg via INTRA_ARTICULAR

## 2022-08-08 MED ORDER — LIDOCAINE HCL 1 % IJ SOLN
4.0000 mL | INTRAMUSCULAR | Status: AC | PRN
  Administered 2022-08-08: 4 mL

## 2022-08-08 NOTE — Progress Notes (Signed)
Chief Complaint: Left shoulder pain     History of Present Illness:    Mitchell Meyers is a 52 y.o. male right-hand-dominant male presents with 3 and half weeks of left shoulder pain.  Not have any specific injury.  He has pain with working overhead.  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  He is from the [place] originally    Surgical History:   none  PMH/PSH/Family History/Social History/Meds/Allergies:    Past Medical History:  Diagnosis Date  . HIV (human immunodeficiency virus infection) ([place])   . Hypertension    Past Surgical History:  Procedure Laterality Date  . APPENDECTOMY  1999   Social History   Socioeconomic History  . Marital status: Married    Spouse name: Not on file  . Number of children: Not on file  . Years of education: Not on file  . Highest education level: Not on file  Occupational History  . Not on file  Tobacco Use  . Smoking status: Former    Types: Cigarettes    Quit date: 2004    Years since quitting: 20.1  . Smokeless tobacco: Never  . Tobacco comments:    social  Vaping Use  . Vaping Use: Never used  Substance and Sexual Activity  . Alcohol use: Not Currently  . Drug use: Not Currently  . Sexual activity: Not on file    Comment: declined condoms  Other Topics Concern  . Not on file  Social History Narrative  . Not on file   Social Determinants of Health   Financial Resource Strain: Not on file  Food Insecurity: Not on file  Transportation Needs: Not on file  Physical Activity: Not on file  Stress: Not on file  Social Connections: Not on file   Family History  Problem Relation Age of Onset  . COPD Mother   . Cancer Father   . Arthritis Father   . Alcohol abuse Father   . Arthritis Sister   . Bipolar disorder Sister   . Hypertension Brother   . Hepatitis C Brother   . Healthy Daughter   . Healthy Daughter    No Known Allergies Current Outpatient  Medications  Medication Sig Dispense Refill  . albuterol (PROAIR HFA) 108 (90 Base) MCG/ACT inhaler 2 puffs every 4 hours as needed only  if your can't catch your breath 18 g 2  . albuterol (PROVENTIL) (2.5 MG/3ML) 0.083% nebulizer solution Take 3 mLs (2.5 mg total) by nebulization every 6 (six) hours as needed for wheezing or shortness of breath. 75 mL 0  . budesonide-formoterol (SYMBICORT) 160-4.5 MCG/ACT inhaler Take 2 puffs first thing in am and then another 2 puffs about 12 hours later. 1 each 12  . clotrimazole (LOTRIMIN) 1 % cream Apply 1 application  topically 2 (two) times daily. (Patient not taking: Reported on 03/13/2022) 30 g 1  . diclofenac sodium (VOLTAREN) 1 % GEL APPLY 2 GRAMS TOPICALLY 4 TIMES DAILY. 400 g 1  . dolutegravir (TIVICAY) 50 MG tablet Take 1 tablet (50 mg total) by mouth daily. 30 tablet 11  . emtricitabine-tenofovir AF (DESCOVY) 200-25 MG tablet Take 1 tablet by mouth daily. 30 tablet 11  . fluconazole (DIFLUCAN) 200 MG tablet Take 1 tablet (200 mg total) by mouth daily. 14 tablet  0  . fluticasone (FLONASE) 50 MCG/ACT nasal spray Place 2 sprays into both nostrils in the morning and at bedtime. (Patient not taking: Reported on 03/13/2022) 16 g 2  . lidocaine (XYLOCAINE) 2 % solution Use as directed 15 mLs in the mouth or throat as needed for mouth pain (Do NOT exceed 8 doses in a 24 hour period). (Patient not taking: Reported on 01/12/2021) 100 mL 0  . LORazepam (ATIVAN) 2 MG tablet Take 2 mg by mouth at bedtime.    . naproxen (NAPROSYN) 500 MG tablet Take 1 tablet (500 mg total) by mouth 2 (two) times daily with a meal. 30 tablet 5  . polyethylene glycol-electrolytes (TRILYTE) 420 g solution Take 4,000 mLs by mouth as directed. (Patient not taking: Reported on 01/12/2021) 4000 mL 0  . polyethylene glycol-electrolytes (TRILYTE) 420 g solution Take 4,000 mLs by mouth as directed. (Patient not taking: Reported on 11/09/2021) 4000 mL 0  . valACYclovir (VALTREX) 1000 MG tablet Take 1  tablet (1,000 mg total) by mouth 2 (two) times daily. 20 tablet 10  . vitamin C (ASCORBIC ACID) 500 MG tablet Take 500 mg by mouth daily.     No current facility-administered medications for this visit.   No results found.  Review of Systems:   A ROS was performed including pertinent positives and negatives as documented in the HPI.  Physical Exam :   Constitutional: NAD and appears stated age Neurological: Alert and oriented Psych: Appropriate affect and cooperative There were no vitals taken for this visit.   Comprehensive Musculoskeletal Exam:    Left shoulder with tenderness about the anterior lateral deltoid.  Full forward elevation to 170 degrees with external rotation at side to 50 degrees.  Internal rotation is to T12 bilaterally.  Negative belly press, Neer impingement is positive  Imaging:   Xray (3 views left shoulder): Normal   I personally reviewed and interpreted the radiographs.   Assessment:   52 y.o. male with evidence of left shoulder impingement and rotator cuff tendinitis.  To this regard I did offer him a subacromial ultrasound-guided injection which I do believe would likely give him symptomatic relief as the strength is quite good not believe he has an underlying tear.  Will plan to proceed with this.  I will see him back as needed.  Plan :    -Left shoulder subacromial injection provided after verbal consent obtained    Procedure Note  Patient: Mitchell Meyers             Date of Birth: [birth year]           MRN: [id number]             Visit Date: 08/08/2022  Procedures: Visit Diagnoses:  1. Chronic left shoulder pain     Large Joint Inj: L subacromial bursa on 08/08/2022 10:57 AM Indications: pain Details: 22 G 1.5 in needle, ultrasound-guided anterior approach  Arthrogram: No  Medications: 4 mL lidocaine 1 %; 80 mg triamcinolone acetonide 40 MG/ML Outcome: tolerated well, no immediate complications Procedure, treatment alternatives,  risks and benefits explained, specific risks discussed. Consent was given by the patient. Immediately prior to procedure a time out was called to verify the correct patient, procedure, equipment, support staff and site/side marked as required. Patient was prepped and draped in the usual sterile fashion.          I personally saw and evaluated the patient, and participated in the management and treatment plan.  Vanetta Mulders, MD Attending  Physician, Orthopedic Surgery  This document was dictated using Dragon voice recognition software. A reasonable attempt at proof reading has been made to minimize errors.

## 2022-08-16 ENCOUNTER — Ambulatory Visit: Payer: Medicaid Other | Admitting: Orthopedic Surgery

## 2022-08-22 ENCOUNTER — Ambulatory Visit: Payer: Medicaid Other | Admitting: Internal Medicine

## 2022-09-14 ENCOUNTER — Ambulatory Visit
Admission: EM | Admit: 2022-09-14 | Discharge: 2022-09-14 | Disposition: A | Discharge status: Home / Self Care | Payer: Medicaid Other | Attending: Physician Assistant | Admitting: Physician Assistant

## 2022-09-14 DIAGNOSIS — K0889 Other specified disorders of teeth and supporting structures: Secondary | ICD-10-CM

## 2022-09-14 MED ORDER — PENICILLIN V POTASSIUM 500 MG PO TABS: 500.0000 mg | ORAL_TABLET | Freq: Two times a day (BID) | ORAL | 0 refills | Status: AC

## 2022-09-14 MED ORDER — KETOROLAC TROMETHAMINE 30 MG/ML IJ SOLN
30.0000 mg | Freq: Once | INTRAMUSCULAR | Status: AC
  Administered 2022-09-14: 30 mg via INTRAMUSCULAR

## 2022-09-14 NOTE — ED Triage Notes (Signed)
Pt reports he wants a script for more penicillin until Friday because his dentist had to reschedule his appointment. Took naproxen for tooth pain that has been going on 1.5 weeks.

## 2022-09-14 NOTE — ED Provider Notes (Addendum)
[facility]    CSN: [id number] Arrival date & time: 09/14/22  1435      History   Chief Complaint No chief complaint on file.   HPI Mitchell Meyers is a 52 y.o. male.   Presents with dental pain.  He reports he was supposed to have a procedure done to extract the tooth yesterday but the dentist had to cancel due to a personal emergency.  He reports the dentist had plan to refill his penicillin on Friday but it was not refilled as the procedure was canceled.  He is requesting a refill of this medicine.  He has an upcoming appointment on this coming Friday with his dentist.  Denies fever, chills.    Past Medical History:  Diagnosis Date   HIV (human immunodeficiency virus infection)    Hypertension     Patient Active Problem List   Diagnosis Date Noted   Tinea capitis 11/09/2021   Asthmatic bronchitis , chronic 09/15/2020   Epigastric burning sensation 02/28/2020   Screening for colorectal cancer 02/28/2020   Osteoarthritis 03/15/2019   Flu vaccine need 03/15/2019   Human immunodeficiency virus (HIV) disease 08/25/2018    Past Surgical History:  Procedure Laterality Date   APPENDECTOMY  1999       Home Medications    Prior to Admission medications   Medication Sig Start Date End Date Taking? Authorizing Provider  penicillin v potassium (VEETID) 500 MG tablet Take 1 tablet (500 mg total) by mouth in the morning and at bedtime for 7 days. 09/14/22 09/21/22 Yes Ward, Tylene Fantasia, PA-C  albuterol (PROAIR HFA) 108 (90 Base) MCG/ACT inhaler 2 puffs every 4 hours as needed only  if your can't catch your breath 12/20/20   Nyoka Cowden, MD  albuterol (PROVENTIL) (2.5 MG/3ML) 0.083% nebulizer solution Take 3 mLs (2.5 mg total) by nebulization every 6 (six) hours as needed for wheezing or shortness of breath. 08/08/20   Wurst, [place], PA-C  budesonide-formoterol (SYMBICORT) 160-4.5 MCG/ACT inhaler Take 2 puffs first thing in am and then another 2 puffs about 12 hours  later. 12/20/20   Nyoka Cowden, MD  clotrimazole (LOTRIMIN) 1 % cream Apply 1 application  topically 2 (two) times daily. Patient not taking: Reported on 03/13/2022 11/21/21   Gardiner Barefoot, MD  diclofenac sodium (VOLTAREN) 1 % GEL APPLY 2 GRAMS TOPICALLY 4 TIMES DAILY. 04/08/19   Pollyann Savoy, MD  dolutegravir (TIVICAY) 50 MG tablet Take 1 tablet (50 mg total) by mouth daily. 03/13/22   Gardiner Barefoot, MD  emtricitabine-tenofovir AF (DESCOVY) 200-25 MG tablet Take 1 tablet by mouth daily. 03/13/22   Gardiner Barefoot, MD  fluconazole (DIFLUCAN) 200 MG tablet Take 1 tablet (200 mg total) by mouth daily. 03/13/22   Comer, Belia Heman, MD  fluticasone (FLONASE) 50 MCG/ACT nasal spray Place 2 sprays into both nostrils in the morning and at bedtime. Patient not taking: Reported on 03/13/2022 12/28/20   Nyoka Cowden, MD  lidocaine (XYLOCAINE) 2 % solution Use as directed 15 mLs in the mouth or throat as needed for mouth pain (Do NOT exceed 8 doses in a 24 hour period). Patient not taking: Reported on 01/12/2021 03/30/20   Wurst, [place], PA-C  LORazepam (ATIVAN) 2 MG tablet Take 2 mg by mouth at bedtime. 12/12/20   [provider]  naproxen (NAPROSYN) 500 MG tablet Take 1 tablet (500 mg total) by mouth 2 (two) times daily with a meal. 03/13/22   Comer, Belia Heman, MD  polyethylene glycol-electrolytes (TRILYTE) 420 g solution Take 4,000 mLs by mouth as directed. Patient not taking: Reported on 01/12/2021 09/13/20   Marguerita Merles, Reuel Boom, MD  polyethylene glycol-electrolytes (TRILYTE) 420 g solution Take 4,000 mLs by mouth as directed. Patient not taking: Reported on 11/09/2021 01/01/21   Marguerita Merles, Reuel Boom, MD  valACYclovir (VALTREX) 1000 MG tablet Take 1 tablet (1,000 mg total) by mouth 2 (two) times daily. 03/13/22   Comer, Belia Heman, MD  vitamin C (ASCORBIC ACID) 500 MG tablet Take 500 mg by mouth daily.    [provider]    Family History Family History  Problem Relation Age of  Onset   COPD Mother    Cancer Father    Arthritis Father    Alcohol abuse Father    Arthritis Sister    Bipolar disorder Sister    Hypertension Brother    Hepatitis C Brother    Healthy Daughter    Healthy Daughter     Social History Social History   Tobacco Use   Smoking status: Former    Types: Cigarettes    Quit date: 2004    Years since quitting: 20.2   Smokeless tobacco: Never   Tobacco comments:    social  Building services engineer Use: Never used  Substance Use Topics   Alcohol use: Not Currently   Drug use: Not Currently     Allergies   Patient has no known allergies.   Review of Systems Review of Systems  Constitutional:  Negative for chills and fever.  HENT:  Positive for dental problem. Negative for ear pain and sore throat.   Eyes:  Negative for pain and visual disturbance.  Respiratory:  Negative for cough and shortness of breath.   Cardiovascular:  Negative for chest pain and palpitations.  Gastrointestinal:  Negative for abdominal pain and vomiting.  Genitourinary:  Negative for dysuria and hematuria.  Musculoskeletal:  Negative for arthralgias and back pain.  Skin:  Negative for color change and rash.  Neurological:  Negative for seizures and syncope.  All other systems reviewed and are negative.    Physical Exam Triage Vital Signs ED Triage Vitals  Enc Vitals Group     BP --      Pulse --      Resp --      Temp 09/14/22 1442 98 F (36.7 C)     Temp src --      SpO2 --      Weight --      Height --      Head Circumference --      Peak Flow --      Pain Score 09/14/22 1443 8     Pain Loc --      Pain Edu? --      Excl. in GC? --    No data found.  Updated Vital Signs Temp 98 F (36.7 C)   Visual Acuity Right Eye Distance:   Left Eye Distance:   Bilateral Distance:    Right Eye Near:   Left Eye Near:    Bilateral Near:     Physical Exam Vitals and nursing note reviewed.  Constitutional:      General: He is not in acute  distress.    Appearance: He is well-developed.  HENT:     Head: Normocephalic and atraumatic.     Mouth/Throat:     Dentition: Abnormal dentition.  Eyes:     Conjunctiva/sclera: Conjunctivae normal.  Cardiovascular:  Rate and Rhythm: Normal rate and regular rhythm.     Heart sounds: No murmur heard. Pulmonary:     Effort: Pulmonary effort is normal. No respiratory distress.     Breath sounds: Normal breath sounds.  Abdominal:     Palpations: Abdomen is soft.     Tenderness: There is no abdominal tenderness.  Musculoskeletal:        General: No swelling.     Cervical back: Neck supple.  Skin:    General: Skin is warm and dry.     Capillary Refill: Capillary refill takes less than 2 seconds.  Neurological:     Mental Status: He is alert.  Psychiatric:        Mood and Affect: Mood normal.      UC Treatments / Results  Labs (all labs ordered are listed, but only abnormal results are displayed) Labs Reviewed - No data to display  EKG   Radiology No results found.  Procedures Procedures (including critical care time)  Medications Ordered in UC Medications  ketorolac (TORADOL) 30 MG/ML injection 30 mg (30 mg Intramuscular Given 09/14/22 1459)    Initial Impression / Assessment and Plan / UC Course  I have reviewed the triage vital signs and the nursing notes.  Pertinent labs & imaging results that were available during my care of the patient were reviewed by me and considered in my medical decision making (see chart for details).     Dental pain.  Antibiotic prescribed.  Toradol given in clinic today.  Supportive care discussed.  Advised to keep appointment with dentist.  Reports he was given Pen-Vee by dentist requesting refill of this Final Clinical Impressions(s) / UC Diagnoses   Final diagnoses:  Pain, dental     Discharge Instructions      Take antibiotic as prescribed Keep follow up with Dentist   ED Prescriptions     Medication Sig Dispense  Auth. Provider   penicillin v potassium (VEETID) 500 MG tablet Take 1 tablet (500 mg total) by mouth in the morning and at bedtime for 7 days. 14 tablet Ward, Tylene FantasiaJessica Z, PA-C      PDMP not reviewed this encounter.   Ward, Tylene FantasiaJessica Z, PA-C 09/14/22 1511    Ward, Tylene FantasiaJessica Z, PA-C 09/14/22 1512

## 2022-09-14 NOTE — Discharge Instructions (Signed)
Take antibiotic as prescribed Keep follow up with Dentist

## 2022-09-24 ENCOUNTER — Ambulatory Visit: Payer: Medicaid Other | Admitting: Rheumatology

## 2022-11-15 ENCOUNTER — Ambulatory Visit (HOSPITAL_BASED_OUTPATIENT_CLINIC_OR_DEPARTMENT_OTHER): Payer: Medicaid Other | Admitting: Orthopaedic Surgery

## 2022-12-04 ENCOUNTER — Ambulatory Visit (HOSPITAL_BASED_OUTPATIENT_CLINIC_OR_DEPARTMENT_OTHER): Payer: Medicaid Other | Admitting: Orthopaedic Surgery

## 2022-12-04 DIAGNOSIS — M7582 Other shoulder lesions, left shoulder: Secondary | ICD-10-CM

## 2022-12-04 DIAGNOSIS — M25512 Pain in left shoulder: Secondary | ICD-10-CM

## 2022-12-04 MED ORDER — LIDOCAINE HCL 1 % IJ SOLN
4.0000 mL | INTRAMUSCULAR | Status: AC | PRN
  Administered 2022-12-04: 4 mL

## 2022-12-04 MED ORDER — TRIAMCINOLONE ACETONIDE 40 MG/ML IJ SUSP
80.0000 mg | INTRAMUSCULAR | Status: AC | PRN
  Administered 2022-12-04: 80 mg via INTRA_ARTICULAR

## 2022-12-04 NOTE — Progress Notes (Signed)
Chief Complaint: Left shoulder pain     History of Present Illness:   12/04/2022: Presents today for follow-up and additional subacromial injection.  He did get 2 months of very good relief and is hoping to pursue an additional injection today.  Mitchell Meyers is a 52 y.o. male right-hand-dominant male presents with 3 and half weeks of left shoulder pain.  Not have any specific injury.  He has pain with working overhead.  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  He is from the [place] originally    Surgical History:   none  PMH/PSH/Family History/Social History/Meds/Allergies:    Past Medical History:  Diagnosis Date   HIV (human immunodeficiency virus infection) (HCC)    Hypertension    Past Surgical History:  Procedure Laterality Date   APPENDECTOMY  1999   Social History   Socioeconomic History   Marital status: Married    Spouse name: Not on file   Number of children: Not on file   Years of education: Not on file   Highest education level: Not on file  Occupational History   Not on file  Tobacco Use   Smoking status: Former    Types: Cigarettes    Quit date: 2004    Years since quitting: 20.4   Smokeless tobacco: Never   Tobacco comments:    social  Building services engineer Use: Never used  Substance and Sexual Activity   Alcohol use: Not Currently   Drug use: Not Currently   Sexual activity: Not on file    Comment: declined condoms  Other Topics Concern   Not on file  Social History Narrative   Not on file   Social Determinants of Health   Financial Resource Strain: Not on file  Food Insecurity: Not on file  Transportation Needs: Not on file  Physical Activity: Not on file  Stress: Not on file  Social Connections: Not on file   Family History  Problem Relation Age of Onset   COPD Mother    Cancer Father    Arthritis Father    Alcohol abuse Father    Arthritis Sister    Bipolar  disorder Sister    Hypertension Brother    Hepatitis C Brother    Healthy Daughter    Healthy Daughter    No Known Allergies Current Outpatient Medications  Medication Sig Dispense Refill   albuterol (PROAIR HFA) 108 (90 Base) MCG/ACT inhaler 2 puffs every 4 hours as needed only  if your can't catch your breath 18 g 2   albuterol (PROVENTIL) (2.5 MG/3ML) 0.083% nebulizer solution Take 3 mLs (2.5 mg total) by nebulization every 6 (six) hours as needed for wheezing or shortness of breath. 75 mL 0   budesonide-formoterol (SYMBICORT) 160-4.5 MCG/ACT inhaler Take 2 puffs first thing in am and then another 2 puffs about 12 hours later. 1 each 12   clotrimazole (LOTRIMIN) 1 % cream Apply 1 application  topically 2 (two) times daily. (Patient not taking: Reported on 03/13/2022) 30 g 1   diclofenac sodium (VOLTAREN) 1 % GEL APPLY 2 GRAMS TOPICALLY 4 TIMES DAILY. 400 g 1   dolutegravir (TIVICAY) 50 MG tablet Take 1 tablet (50 mg total) by mouth daily. 30 tablet 11   emtricitabine-tenofovir AF (DESCOVY) 200-25 MG  tablet Take 1 tablet by mouth daily. 30 tablet 11   fluconazole (DIFLUCAN) 200 MG tablet Take 1 tablet (200 mg total) by mouth daily. 14 tablet 0   fluticasone (FLONASE) 50 MCG/ACT nasal spray Place 2 sprays into both nostrils in the morning and at bedtime. (Patient not taking: Reported on 03/13/2022) 16 g 2   lidocaine (XYLOCAINE) 2 % solution Use as directed 15 mLs in the mouth or throat as needed for mouth pain (Do NOT exceed 8 doses in a 24 hour period). (Patient not taking: Reported on 01/12/2021) 100 mL 0   LORazepam (ATIVAN) 2 MG tablet Take 2 mg by mouth at bedtime.     naproxen (NAPROSYN) 500 MG tablet Take 1 tablet (500 mg total) by mouth 2 (two) times daily with a meal. 30 tablet 5   polyethylene glycol-electrolytes (TRILYTE) 420 g solution Take 4,000 mLs by mouth as directed. (Patient not taking: Reported on 01/12/2021) 4000 mL 0   polyethylene glycol-electrolytes (TRILYTE) 420 g solution  Take 4,000 mLs by mouth as directed. (Patient not taking: Reported on 11/09/2021) 4000 mL 0   valACYclovir (VALTREX) 1000 MG tablet Take 1 tablet (1,000 mg total) by mouth 2 (two) times daily. 20 tablet 10   vitamin C (ASCORBIC ACID) 500 MG tablet Take 500 mg by mouth daily.     No current facility-administered medications for this visit.   No results found.  Review of Systems:   A ROS was performed including pertinent positives and negatives as documented in the HPI.  Physical Exam :   Constitutional: NAD and appears stated age Neurological: Alert and oriented Psych: Appropriate affect and cooperative There were no vitals taken for this visit.   Comprehensive Musculoskeletal Exam:    Left shoulder with tenderness about the anterior lateral deltoid.  Full forward elevation to 170 degrees with external rotation at side to 50 degrees.  Internal rotation is to T12 bilaterally.  Negative belly press, Neer impingement is positive  Imaging:   Xray (3 views left shoulder): Normal   I personally reviewed and interpreted the radiographs.   Assessment:   52 y.o. male with evidence of left shoulder impingement and rotator cuff tendinitis.  To this regard I did offer him a subacromial ultrasound-guided injection which I do believe would likely give him symptomatic relief as the strength is quite good not believe he has an underlying tear.  We did discuss that should this wear off we would like to consider an additional MRI to see if there is any type of underlying tear.  He will send [place] a MyChart message should this wear off in the future Plan :    -Left shoulder subacromial injection provided after verbal consent obtained    Procedure Note  Patient: Mitchell Meyers             Date of Birth: Apr 20, 1971           MRN: [id number]             Visit Date: 12/04/2022  Procedures: Visit Diagnoses:  No diagnosis found.   Large Joint Inj: L subacromial bursa on 12/04/2022 12:30  PM Indications: pain Details: 22 G 1.5 in needle, ultrasound-guided anterior approach  Arthrogram: No  Medications: 4 mL lidocaine 1 %; 80 mg triamcinolone acetonide 40 MG/ML Outcome: tolerated well, no immediate complications Procedure, treatment alternatives, risks and benefits explained, specific risks discussed. Consent was given by the patient. Immediately prior to procedure a time out was called to verify the  correct patient, procedure, equipment, support staff and site/side marked as required. Patient was prepped and draped in the usual sterile fashion.           I personally saw and evaluated the patient, and participated in the management and treatment plan.  Huel Cote, MD Attending Physician, Orthopedic Surgery  This document was dictated using Dragon voice recognition software. A reasonable attempt at proof reading has been made to minimize errors.

## 2022-12-18 ENCOUNTER — Encounter: Payer: Self-pay | Admitting: Internal Medicine

## 2022-12-19 ENCOUNTER — Other Ambulatory Visit: Payer: Self-pay | Admitting: Internal Medicine

## 2022-12-19 MED ORDER — CLOTRIMAZOLE-BETAMETHASONE 1-0.05 % EX CREA: 1.0000 | TOPICAL_CREAM | Freq: Two times a day (BID) | CUTANEOUS | 5 refills | Status: DC

## 2023-01-15 ENCOUNTER — Ambulatory Visit: Payer: Medicaid Other | Admitting: Internal Medicine

## 2023-02-21 ENCOUNTER — Other Ambulatory Visit: Payer: Self-pay | Admitting: Internal Medicine

## 2023-02-21 DIAGNOSIS — B20 Human immunodeficiency virus [HIV] disease: Secondary | ICD-10-CM

## 2023-03-18 ENCOUNTER — Ambulatory Visit: Payer: Medicaid Other | Admitting: Internal Medicine

## 2023-03-23 ENCOUNTER — Other Ambulatory Visit: Payer: Self-pay | Admitting: Internal Medicine

## 2023-03-23 DIAGNOSIS — B20 Human immunodeficiency virus [HIV] disease: Secondary | ICD-10-CM

## 2023-03-24 NOTE — Progress Notes (Unsigned)
HPI: Mitchell Meyers is a 52 y.o. male who presents to the [facility] pharmacy clinic for HIV follow-up.  Patient Active Problem List   Diagnosis Date Noted   Tinea capitis 11/09/2021   Asthmatic bronchitis , chronic (HCC) 09/15/2020   Epigastric burning sensation 02/28/2020   Screening for colorectal cancer 02/28/2020   Osteoarthritis 03/15/2019   Flu vaccine need 03/15/2019   Human immunodeficiency virus (HIV) disease (HCC) 08/25/2018    Patient's Medications  New Prescriptions   No medications on file  Previous Medications   ALBUTEROL (PROAIR HFA) 108 (90 BASE) MCG/ACT INHALER    2 puffs every 4 hours as needed only  if your can't catch your breath   ALBUTEROL (PROVENTIL) (2.5 MG/3ML) 0.083% NEBULIZER SOLUTION    Take 3 mLs (2.5 mg total) by nebulization every 6 (six) hours as needed for wheezing or shortness of breath.   BUDESONIDE-FORMOTEROL (SYMBICORT) 160-4.5 MCG/ACT INHALER    Take 2 puffs first thing in am and then another 2 puffs about 12 hours later.   CLOTRIMAZOLE (LOTRIMIN) 1 % CREAM    Apply 1 application  topically 2 (two) times daily.   CLOTRIMAZOLE-BETAMETHASONE (LOTRISONE) CREAM    Apply 1 Application topically 2 (two) times daily.   DESCOVY 200-25 MG TABLET    TAKE 1 TABLET BY MOUTH DAILY   DICLOFENAC SODIUM (VOLTAREN) 1 % GEL    APPLY 2 GRAMS TOPICALLY 4 TIMES DAILY.   FLUCONAZOLE (DIFLUCAN) 200 MG TABLET    Take 1 tablet (200 mg total) by mouth daily.   FLUTICASONE (FLONASE) 50 MCG/ACT NASAL SPRAY    Place 2 sprays into both nostrils in the morning and at bedtime.   LIDOCAINE (XYLOCAINE) 2 % SOLUTION    Use as directed 15 mLs in the mouth or throat as needed for mouth pain (Do NOT exceed 8 doses in a 24 hour period).   LORAZEPAM (ATIVAN) 2 MG TABLET    Take 2 mg by mouth at bedtime.   NAPROXEN (NAPROSYN) 500 MG TABLET    Take 1 tablet (500 mg total) by mouth 2 (two) times daily with a meal.   POLYETHYLENE GLYCOL-ELECTROLYTES (TRILYTE) 420 G SOLUTION    Take 4,000 mLs  by mouth as directed.   POLYETHYLENE GLYCOL-ELECTROLYTES (TRILYTE) 420 G SOLUTION    Take 4,000 mLs by mouth as directed.   TIVICAY 50 MG TABLET    TAKE 1 TABLET(50 MG) BY MOUTH DAILY   VALACYCLOVIR (VALTREX) 1000 MG TABLET    Take 1 tablet (1,000 mg total) by mouth 2 (two) times daily.   VITAMIN C (ASCORBIC ACID) 500 MG TABLET    Take 500 mg by mouth daily.  Modified Medications   No medications on file  Discontinued Medications   No medications on file    Labs: Lab Results  Component Value Date   HIV1RNAQUANT Not Detected 03/13/2022   HIV1RNAQUANT Not Detected 11/09/2021   HIV1RNAQUANT NOT DETECTED 08/23/2021   CD4TABS 793 03/13/2022   CD4TABS 958 02/22/2021   CD4TABS 595 11/01/2020    RPR and STI Lab Results  Component Value Date   LABRPR NON-REACTIVE 08/23/2021   LABRPR NON-REACTIVE 02/22/2021   LABRPR NON-REACTIVE 11/01/2020   LABRPR NON-REACTIVE 12/21/2019   LABRPR NON-REACTIVE 08/03/2018    STI Results GC CT  11/01/2020 10:22 AM Negative  Negative   12/21/2019  3:17 PM Negative  Negative   08/03/2018 12:00 AM Negative  Negative     Hepatitis B Lab Results  Component Value Date  HEPBSAB REACTIVE (A) 08/03/2018   HEPBSAG NON-REACTIVE 08/03/2018   HEPBCAB NON-REACTIVE 08/03/2018   Hepatitis C Lab Results  Component Value Date   HEPCAB NON-REACTIVE 08/03/2018   Hepatitis A Lab Results  Component Value Date   HAV REACTIVE (A) 08/03/2018   Lipids: Lab Results  Component Value Date   CHOL 221 (H) 11/01/2020   TRIG 118 11/01/2020   HDL 38 (L) 11/01/2020   CHOLHDL 5.8 (H) 11/01/2020   LDLCALC 159 (H) 11/01/2020    Current HIV Regimen: Tivicay + Descovy once daily  Assessment: [name] is here for HIV follow up. He was last seen by Dr. Luciana Axe on 03/13/22. He has missed several appointments since then. Per review of his fill history, he has been getting both Tivicay and Descovy filled. He reports no concerns with adverse effects or missed doses. He has  previously trialed Biktarvy, but this was discontinued to due adverse effects, nausea and vomiting x 2 weeks. I have counseled Jonmarc on importance of adherence to both medications and office visits. He expressed understanding of this. We will get labs today. He politely declines STI testing today, except for RPR.  Plan: - Will obtain routine HIV RNA, CD4, RPR today. - Covid + Flu shot in clinic today - Sent refills to last until next appointment w/ Dr. Luciana Axe - Follow up appointment with Dr. Luciana Axe on 06/18/23  Lora Paula, PharmD PGY-2 Infectious Diseases Pharmacy Resident [facility] 03/24/2023, 9:32 PM

## 2023-03-24 NOTE — Telephone Encounter (Signed)
Appt 10.15.

## 2023-03-25 ENCOUNTER — Ambulatory Visit (INDEPENDENT_AMBULATORY_CARE_PROVIDER_SITE_OTHER): Payer: Medicaid Other | Admitting: Pharmacist

## 2023-03-25 ENCOUNTER — Telehealth: Payer: Self-pay

## 2023-03-25 ENCOUNTER — Other Ambulatory Visit (HOSPITAL_COMMUNITY): Payer: Self-pay

## 2023-03-25 ENCOUNTER — Other Ambulatory Visit: Payer: Self-pay

## 2023-03-25 DIAGNOSIS — B20 Human immunodeficiency virus [HIV] disease: Secondary | ICD-10-CM

## 2023-03-25 DIAGNOSIS — Z23 Encounter for immunization: Secondary | ICD-10-CM

## 2023-03-25 MED ORDER — TIVICAY 50 MG PO TABS
50.0000 mg | ORAL_TABLET | Freq: Every day | ORAL | 3 refills | Status: DC
Start: 2023-03-25 — End: 2023-08-05

## 2023-03-25 MED ORDER — DESCOVY 200-25 MG PO TABS: 1.0000 | ORAL_TABLET | Freq: Every day | ORAL | 3 refills | Status: DC

## 2023-03-25 NOTE — Telephone Encounter (Signed)
[facility] Patient Advocate Encounter  Insurance verification completed.    The patient is insured through [facility]. Patient has Medicare and is not eligible for a copay card, but may be able to apply for patient assistance, if available.    Ran test claim for BIKTARVY  The current 30 day co-pay is $0.  We will continue to follow to see if copay assistance is needed.  This test claim was processed through [facility]- copay amounts may vary at other pharmacies due to pharmacy/plan contracts, or as the patient moves through the different stages of their insurance plan.

## 2023-03-26 LAB — T-HELPER CELLS (CD4) COUNT (NOT AT ARMC)
CD4 % Helper T Cell: 51 % (ref 33–65)
CD4 T Cell Abs: 1039 /uL (ref 400–1790)

## 2023-03-28 LAB — HIV-1 RNA QUANT-NO REFLEX-BLD
HIV 1 RNA Quant: NOT DETECTED {copies}/mL
HIV-1 RNA Quant, Log: NOT DETECTED {Log}

## 2023-03-28 LAB — RPR: RPR Ser Ql: NONREACTIVE

## 2023-05-13 ENCOUNTER — Encounter: Payer: Self-pay | Admitting: Internal Medicine

## 2023-06-18 ENCOUNTER — Ambulatory Visit: Payer: Medicaid Other | Admitting: Internal Medicine

## 2023-07-05 ENCOUNTER — Emergency Department (HOSPITAL_COMMUNITY)
Admission: EM | Admit: 2023-07-05 | Discharge: 2023-07-05 | Discharge status: Against Medical Advice | Payer: Medicaid Other

## 2023-07-05 NOTE — ED Notes (Signed)
Called x3 for triage, no answer, check lobby and bathroom and still no answer.

## 2023-07-23 ENCOUNTER — Other Ambulatory Visit: Payer: Self-pay | Admitting: Pharmacist

## 2023-07-23 DIAGNOSIS — B20 Human immunodeficiency virus [HIV] disease: Secondary | ICD-10-CM

## 2023-07-24 NOTE — Telephone Encounter (Signed)
Appt 2/14

## 2023-07-25 ENCOUNTER — Ambulatory Visit: Payer: Medicaid Other | Admitting: Internal Medicine

## 2023-08-05 ENCOUNTER — Other Ambulatory Visit: Payer: Self-pay

## 2023-08-05 ENCOUNTER — Encounter: Payer: Self-pay | Admitting: Internal Medicine

## 2023-08-05 ENCOUNTER — Ambulatory Visit: Payer: Medicaid Other | Admitting: Internal Medicine

## 2023-08-05 VITALS — BP 157/96 | HR 84 | Temp 97.3°F | Ht 69.0 in | Wt 218.0 lb

## 2023-08-05 DIAGNOSIS — Z5181 Encounter for therapeutic drug level monitoring: Secondary | ICD-10-CM | POA: Insufficient documentation

## 2023-08-05 DIAGNOSIS — B20 Human immunodeficiency virus [HIV] disease: Secondary | ICD-10-CM | POA: Diagnosis present

## 2023-08-05 DIAGNOSIS — Z79899 Other long term (current) drug therapy: Secondary | ICD-10-CM | POA: Insufficient documentation

## 2023-08-05 MED ORDER — DESCOVY 200-25 MG PO TABS: 1.0000 | ORAL_TABLET | Freq: Every day | ORAL | 9 refills | Status: DC

## 2023-08-05 MED ORDER — TIVICAY 50 MG PO TABS: 50.0000 mg | ORAL_TABLET | Freq: Every day | ORAL | 9 refills | Status: DC

## 2023-08-05 NOTE — Assessment & Plan Note (Signed)
 Will check the CMP, cbc today

## 2023-08-05 NOTE — Progress Notes (Signed)
   Subjective:    Patient ID: Mitchell Meyers, male    DOB: 09-15-1970, 53 y.o.   MRN: [id number]  HPI Vondell is here for follow-up of HIV. He has had intermittent follow-up and last saw me over 1 year ago and followed up with pharmacy in October 2024.  Has been on Tivicay and Descovy and denies any issues or missed doses.  No issues with getting or obtaining the medication.  Has remained not detected in the CD4 count in the normal range. Traveling to [place] next week.     Review of Systems  Constitutional:  Negative for fatigue.  Gastrointestinal:  Negative for diarrhea and nausea.  Skin:  Negative for rash.       Objective:   Physical Exam Eyes:     General: No scleral icterus. Pulmonary:     Effort: Pulmonary effort is normal.  Neurological:     Mental Status: He is alert.   SH: no tobacco        Assessment & Plan:

## 2023-08-05 NOTE — Patient Instructions (Signed)
 Get a typhoid vaccine prior to travel to [place]

## 2023-08-05 NOTE — Assessment & Plan Note (Signed)
 Dioing well, no concerns and previous labs reviewed.  Will recheck today and refills provided.   Follow up in 6 months.

## 2023-08-05 NOTE — Assessment & Plan Note (Signed)
 Will check a lipid panel. Will discuss the reprieve trial next visit

## 2023-08-06 LAB — T-HELPER CELL (CD4) - (RCID CLINIC ONLY)
CD4 % Helper T Cell: 47 % (ref 33–65)
CD4 T Cell Abs: 726 /uL (ref 400–1790)

## 2023-08-07 LAB — COMPLETE METABOLIC PANEL WITH GFR
AG Ratio: 1 (calc) (ref 1.0–2.5)
ALT: 22 U/L (ref 9–46)
AST: 20 U/L (ref 10–35)
Albumin: 4.1 g/dL (ref 3.6–5.1)
Alkaline phosphatase (APISO): 66 U/L (ref 35–144)
BUN: 11 mg/dL (ref 7–25)
CO2: 26 mmol/L (ref 20–32)
Calcium: 9.7 mg/dL (ref 8.6–10.3)
Chloride: 101 mmol/L (ref 98–110)
Creat: 1.06 mg/dL (ref 0.70–1.30)
Globulin: 4.3 g/dL — ABNORMAL HIGH (ref 1.9–3.7)
Glucose, Bld: 84 mg/dL (ref 65–99)
Potassium: 3.9 mmol/L (ref 3.5–5.3)
Sodium: 137 mmol/L (ref 135–146)
Total Bilirubin: 0.6 mg/dL (ref 0.2–1.2)
Total Protein: 8.4 g/dL — ABNORMAL HIGH (ref 6.1–8.1)
eGFR: 84 mL/min/{1.73_m2} (ref >=60)

## 2023-08-07 LAB — CBC WITH DIFFERENTIAL/PLATELET
Absolute Lymphocytes: 1763 {cells}/uL (ref 850–3900)
Absolute Monocytes: 518 {cells}/uL (ref 200–950)
Basophils Absolute: 23 {cells}/uL (ref 0–200)
Basophils Relative: 0.3 %
Eosinophils Absolute: 68 {cells}/uL (ref 15–500)
Eosinophils Relative: 0.9 %
HCT: 46.1 % (ref 38.5–50.0)
Hemoglobin: 15.5 g/dL (ref 13.2–17.1)
MCH: 29.2 pg (ref 27.0–33.0)
MCHC: 33.6 g/dL (ref 32.0–36.0)
MCV: 87 fL (ref 80.0–100.0)
MPV: 9.5 fL (ref 7.5–12.5)
Monocytes Relative: 6.9 %
Neutro Abs: 5130 {cells}/uL (ref 1500–7800)
Neutrophils Relative %: 68.4 %
Platelets: 394 10*3/uL (ref 140–400)
RBC: 5.3 10*6/uL (ref 4.20–5.80)
RDW: 14.1 % (ref 11.0–15.0)
Total Lymphocyte: 23.5 %
WBC: 7.5 10*3/uL (ref 3.8–10.8)

## 2023-08-07 LAB — HIV-1 RNA QUANT-NO REFLEX-BLD
HIV 1 RNA Quant: NOT DETECTED {copies}/mL
HIV-1 RNA Quant, Log: NOT DETECTED {Log_copies}/mL

## 2023-08-07 LAB — LIPID PANEL
Cholesterol: 198 mg/dL (ref <200)
HDL: 27 mg/dL — ABNORMAL LOW (ref >=40)
LDL Cholesterol (Calc): 137 mg/dL — ABNORMAL HIGH
Non-HDL Cholesterol (Calc): 171 mg/dL — ABNORMAL HIGH (ref <130)
Total CHOL/HDL Ratio: 7.3 (calc) — ABNORMAL HIGH (ref <5.0)
Triglycerides: 203 mg/dL — ABNORMAL HIGH (ref <150)

## 2023-08-13 ENCOUNTER — Other Ambulatory Visit: Payer: Self-pay | Admitting: Pharmacist

## 2023-08-13 ENCOUNTER — Other Ambulatory Visit: Payer: Self-pay | Admitting: Internal Medicine

## 2023-08-13 DIAGNOSIS — B20 Human immunodeficiency virus [HIV] disease: Secondary | ICD-10-CM

## 2023-11-05 ENCOUNTER — Encounter: Payer: Self-pay | Admitting: Pharmacist

## 2023-11-06 ENCOUNTER — Other Ambulatory Visit (HOSPITAL_COMMUNITY): Payer: Self-pay

## 2023-11-10 ENCOUNTER — Other Ambulatory Visit (HOSPITAL_COMMUNITY): Payer: Self-pay

## 2023-12-17 ENCOUNTER — Other Ambulatory Visit: Payer: Self-pay

## 2023-12-17 ENCOUNTER — Encounter: Payer: Self-pay | Admitting: Pharmacist

## 2023-12-17 ENCOUNTER — Other Ambulatory Visit: Payer: Self-pay | Admitting: Pharmacist

## 2023-12-17 ENCOUNTER — Other Ambulatory Visit (HOSPITAL_COMMUNITY): Payer: Self-pay

## 2023-12-17 ENCOUNTER — Other Ambulatory Visit: Payer: Self-pay | Admitting: Infectious Diseases

## 2023-12-17 DIAGNOSIS — B20 Human immunodeficiency virus [HIV] disease: Secondary | ICD-10-CM

## 2023-12-17 DIAGNOSIS — R21 Rash and other nonspecific skin eruption: Secondary | ICD-10-CM

## 2023-12-17 MED ORDER — DESCOVY 200-25 MG PO TABS
1.0000 | ORAL_TABLET | Freq: Every day | ORAL | 9 refills | Status: AC
  Filled 2023-12-17: qty 30, 30d supply, fill #0
  Filled 2024-01-08: qty 30, 30d supply, fill #1
  Filled 2024-02-03: qty 30, 30d supply, fill #2
  Filled 2024-03-08 – 2024-04-26 (×3): qty 30, 30d supply, fill #3
  Filled 2024-05-20: qty 30, 30d supply, fill #4

## 2023-12-17 MED ORDER — TIVICAY 50 MG PO TABS
50.0000 mg | ORAL_TABLET | Freq: Every day | ORAL | 9 refills | Status: AC
  Filled 2023-12-17: qty 30, 30d supply, fill #0
  Filled 2024-01-08: qty 30, 30d supply, fill #1
  Filled 2024-02-03: qty 30, 30d supply, fill #2
  Filled 2024-03-08 – 2024-04-26 (×2): qty 30, 30d supply, fill #3
  Filled 2024-05-20: qty 30, 30d supply, fill #4

## 2023-12-17 NOTE — Telephone Encounter (Signed)
 I will handle the medication part... Also routing to Dr. Dea to look at pictures and provide response. Thanks!

## 2023-12-17 NOTE — Progress Notes (Signed)
 Specialty Pharmacy Initial Fill Coordination Note  Mitchell Meyers is a 53 y.o. male contacted today regarding initial fill of specialty medication(s) Dolutegravir  Sodium (Tivicay ); Emtricitabine -Tenofovir  AF (Descovy )   Patient requested Delivery   Delivery date: 12/18/23   Verified address: [address] [place] [address]   Medication will be filled on 12/17/23.   Patient is aware of 0.00 copayment.

## 2023-12-17 NOTE — Progress Notes (Signed)
 Specialty Pharmacy Initiation Note   Mitchell Meyers is a 53 y.o. male who will be followed by the specialty pharmacy service for RxSp HIV    Review of administration, indication, effectiveness, safety, potential side effects, storage/disposable, and missed dose instructions occurred today for patient's specialty medication(s) Dolutegravir  Sodium (Tivicay ); Emtricitabine -Tenofovir  AF (Descovy )     Patient/Caregiver did not have any additional questions or concerns.   Patient's therapy is appropriate to: Continue    Goals Addressed             This Visit's Progress    Achieve Undetectable HIV Viral Load < 20       Patient is on track. Patient will maintain adherence      Increase CD4 count until steady state       Patient is on track. Patient will maintain adherence      Maintain optimal adherence to therapy       Patient is not on track and improving. Patient will work on increased adherence         Alan JINNY Geralds Specialty Pharmacist

## 2023-12-24 ENCOUNTER — Other Ambulatory Visit: Payer: Self-pay | Admitting: Internal Medicine

## 2024-01-07 ENCOUNTER — Encounter (INDEPENDENT_AMBULATORY_CARE_PROVIDER_SITE_OTHER): Payer: Self-pay

## 2024-01-08 ENCOUNTER — Other Ambulatory Visit: Payer: Self-pay

## 2024-01-08 NOTE — Progress Notes (Signed)
 Specialty Pharmacy Refill Coordination Note  Veasna Santibanez is a 53 y.o. male contacted today regarding refills of specialty medication(s) Dolutegravir  Sodium (Tivicay ); Emtricitabine -Tenofovir  AF (Descovy )   Patient requested (Patient-Rptd) Delivery   Delivery date: 01/12/24   Verified address: (Patient-Rptd) [address], [place] [address]   Medication will be filled on 01/09/24.

## 2024-01-09 ENCOUNTER — Other Ambulatory Visit: Payer: Self-pay

## 2024-01-09 ENCOUNTER — Other Ambulatory Visit: Payer: Self-pay | Admitting: Medical Genetics

## 2024-01-26 ENCOUNTER — Other Ambulatory Visit (HOSPITAL_COMMUNITY)

## 2024-01-29 ENCOUNTER — Ambulatory Visit (INDEPENDENT_AMBULATORY_CARE_PROVIDER_SITE_OTHER): Admitting: Infectious Diseases

## 2024-01-29 ENCOUNTER — Other Ambulatory Visit: Payer: Self-pay

## 2024-01-29 ENCOUNTER — Encounter: Payer: Self-pay | Admitting: Infectious Diseases

## 2024-01-29 VITALS — BP 162/111 | HR 76 | Temp 97.7°F | Wt 219.0 lb

## 2024-01-29 DIAGNOSIS — Z113 Encounter for screening for infections with a predominantly sexual mode of transmission: Secondary | ICD-10-CM | POA: Diagnosis not present

## 2024-01-29 DIAGNOSIS — R21 Rash and other nonspecific skin eruption: Secondary | ICD-10-CM

## 2024-01-29 DIAGNOSIS — Z5181 Encounter for therapeutic drug level monitoring: Secondary | ICD-10-CM

## 2024-01-29 DIAGNOSIS — Z23 Encounter for immunization: Secondary | ICD-10-CM

## 2024-01-29 DIAGNOSIS — B20 Human immunodeficiency virus [HIV] disease: Secondary | ICD-10-CM

## 2024-01-29 DIAGNOSIS — L4050 Arthropathic psoriasis, unspecified: Secondary | ICD-10-CM

## 2024-01-29 NOTE — Progress Notes (Unsigned)
 [address], [place], [place], [address]                                                                  Phn. [phone]; Fax: [phone]                                                                             Date: 01/30/24  Reason for Visit: Routine HIV care.  HPI: Ladale Sherburn is a 53 y.o.old male with a history of HTN, HIV who is here for regular fu. Patient previously followed by Dr Efrain and was last seen 08/05/23.    Interval hx/current visit: Reports compliance with Descovy  and Tivicay  with no missed doses or concerns. Sexually active with wife, Denies smoking, alcohol  and recreational drugs. He follows with PCP  Showed me rash in his scalp present for over a year. Has triped antifungal cream before but thinks no significant improvement. Feels little itchy, not much. Denies pain, warmth or tenderness. He has developed few more rashes in his lower extremities in the last few months- non painful, itchy.   Reports h/o psoriasis arthritis in the past but does not follow any provdier. Does not take any medications. His appt with Dermatology is in Feb 2026.   ROS: As stated in above HPI; all other systems were reviewed and are otherwise negative unless noted below  No reported fever / chills, night sweats, unintentional weight loss, acute visual change, odynophagia, chest pain/pressure, new or worsened SOB or WOB, nausea, vomiting, diarrhea, dysuria, GU discharge, syncope, seizures, red/hot swollen joints, hallucinations / delusions, rashes, new allergies, unusual / excessive bleeding, swollen lymph nodes, or new hospitalizations/ED visits/Urgent Care visits since the pt was last seen.  PMH/ PSH/ FamHx / Social Hx , medications and allergies reviewed and updated as appropriate; please  see corresponding tab in EHR / prior notes                                        Current Outpatient Medications on File Prior to Visit  Medication Sig Dispense Refill   albuterol  (PROAIR  HFA) 108 (90 Base) MCG/ACT inhaler 2 puffs every 4 hours as needed only  if your can't catch your breath 18 g 2   albuterol  (PROVENTIL ) (2.5 MG/3ML) 0.083% nebulizer solution Take 3 mLs (2.5 mg total) by nebulization every 6 (six) hours as needed for wheezing or shortness of breath. 75 mL 0   budesonide -formoterol  (SYMBICORT ) 160-4.5 MCG/ACT inhaler Take 2 puffs first thing in am and then another 2 puffs about  12 hours later. 1 each 12   colchicine 0.6 MG tablet Take by mouth.     diclofenac  sodium (VOLTAREN ) 1 % GEL APPLY 2 GRAMS TOPICALLY 4 TIMES DAILY. 400 g 1   dolutegravir  (TIVICAY ) 50 MG tablet Take 1 tablet (50 mg total) by mouth daily. 30 tablet 9   emtricitabine -tenofovir  AF (DESCOVY ) 200-25 MG tablet Take 1 tablet by mouth daily. 30 tablet 9   fluconazole  (DIFLUCAN ) 200 MG tablet Take 1 tablet (200 mg total) by mouth daily. 14 tablet 0   lidocaine  (XYLOCAINE ) 2 % solution Use as directed 15 mLs in the mouth or throat as needed for mouth pain (Do NOT exceed 8 doses in a 24 hour period). 100 mL 0   LORazepam  (ATIVAN ) 2 MG tablet Take 2 mg by mouth at bedtime.     naproxen  (NAPROSYN ) 500 MG tablet Take 1 tablet (500 mg total) by mouth 2 (two) times daily with a meal. 30 tablet 5   valACYclovir  (VALTREX ) 1000 MG tablet Take 1 tablet (1,000 mg total) by mouth 2 (two) times daily. 20 tablet 10   amitriptyline (ELAVIL) 50 MG tablet Take by mouth. (Patient not taking: Reported on 01/29/2024)     atorvastatin  (LIPITOR) 20 MG tablet Take by mouth. (Patient not taking: Reported on 01/29/2024)     ciprofloxacin (CIPRO) 500 MG tablet Take 500 mg by mouth 2 (two) times daily. (Patient not taking: Reported on 01/29/2024)     clotrimazole  (LOTRIMIN ) 1 % cream Apply 1 application  topically 2 (two) times daily. (Patient  not taking: Reported on 08/05/2023) 30 g 1   clotrimazole -betamethasone  (LOTRISONE ) cream Apply 1 Application topically 2 (two) times daily. 30 g 5   fluticasone  (FLONASE ) 50 MCG/ACT nasal spray Place 2 sprays into both nostrils in the morning and at bedtime. (Patient not taking: Reported on 01/29/2024) 16 g 2   losartan  (COZAAR ) 25 MG tablet Take 1 tablet by mouth daily. (Patient not taking: Reported on 01/29/2024)     polyethylene glycol-electrolytes (TRILYTE) 420 g solution Take 4,000 mLs by mouth as directed. (Patient not taking: Reported on 01/12/2021) 4000 mL 0   polyethylene glycol-electrolytes (TRILYTE) 420 g solution Take 4,000 mLs by mouth as directed. (Patient not taking: Reported on 11/09/2021) 4000 mL 0   vitamin C (ASCORBIC ACID) 500 MG tablet Take 500 mg by mouth daily. (Patient not taking: Reported on 01/29/2024)     No current facility-administered medications on file prior to visit.    No Known Allergies  Past Medical History:  Diagnosis Date   HIV (human immunodeficiency virus infection) (HCC)    Hypertension    Past Surgical History:  Procedure Laterality Date   APPENDECTOMY  1999   Social History   Socioeconomic History   Marital status: Married    Spouse name: Not on file   Number of children: Not on file   Years of education: Not on file   Highest education level: Not on file  Occupational History   Not on file  Tobacco Use   Smoking status: Former    Current packs/day: 0.00    Types: Cigarettes    Quit date: 2004    Years since quitting: 21.6   Smokeless tobacco: Never   Tobacco comments:    social  Advertising account planner   Vaping status: Never Used  Substance and Sexual Activity   Alcohol  use: Not Currently   Drug use: Not Currently   Sexual activity: Not on file    Comment: declined condoms  Other Topics Concern   Not on file  Social History Narrative   Not on file   Social Drivers of Health   Financial Resource Strain: Low Risk  (01/14/2023)   Received  from [facility]   Overall Financial Resource Strain (CARDIA)    Difficulty of Paying Living Expenses: Not hard at all  Food Insecurity: Low Risk  (03/12/2023)   Received from [facility]   Hunger Vital Sign    Within the past 12 months, you worried that your food would run out before you got money to buy more: Never true    Within the past 12 months, the food you bought just didn't last and you didn't have money to get more. : Never true  Transportation Needs: No Transportation Needs (03/12/2023)   Received from [facility]    In the past 12 months, has lack of reliable transportation kept you from medical appointments, meetings, work or from getting things needed for daily living? : No  Physical Activity: Sufficiently Active (01/14/2023)   Received from [facility]   Exercise Vital Sign    On average, how many days per week do you engage in moderate to strenuous exercise (like a brisk walk)?: 4 days    On average, how many minutes do you engage in exercise at this level?: 40 min  Stress: No Stress Concern Present (01/14/2023)   Received from [facility] - Occupational Stress Questionnaire    Feeling of Stress : Not at all  Social Connections: Socially Integrated (01/14/2023)   Received from [facility]   Social Connection and Isolation Panel    In a typical week, how many times do you talk on the phone with family, friends, or neighbors?: Three times a week    How often do you get together with friends or relatives?: Twice a week    How often do you attend church or religious services?: 1 to 4 times per year    Do you belong to any clubs or organizations such as church groups, unions, fraternal or athletic groups, or school groups?: No    How often do you attend meetings of the clubs or organizations you belong to?: 1 to 4 times per year    Are you married, widowed, divorced, separated, never married, or living  with a partner?: Married  Intimate Partner Violence: Not At Risk (01/14/2023)   Received from [facility]   Humiliation, Afraid, Rape, and Kick questionnaire    Within the last year, have you been afraid of your partner or ex-partner?: No    Within the last year, have you been humiliated or emotionally abused in other ways by your partner or ex-partner?: No    Within the last year, have you been kicked, hit, slapped, or otherwise physically hurt by your partner or ex-partner?: No    Within the last year, have you been raped or forced to have any kind of sexual activity by your partner or ex-partner?: No   Family History  Problem Relation Age of Onset   COPD Mother    Cancer Father    Arthritis Father    Alcohol  abuse Father    Arthritis Sister    Bipolar disorder Sister    Hypertension Brother    Hepatitis C Brother    Healthy Daughter    Healthy Daughter     Vitals  BP (!) 162/111   Pulse 76  Temp 97.7 F (36.5 C) (Oral)   Wt 219 lb (99.3 kg)   SpO2 95%   BMI 32.34 kg/m   Examination  Gen: no acute distress HEENT: [place]/AT, no scleral icterus, no pale conjunctivae, hearing normal, oral mucosa moist Neck: Supple Cardio: Regular rate and rhythm, s1s2 Resp: Pulmonary effort normal in room air, Normal breath sounds  GI: nondistended, soft and non tender  GU: Musc: Extremities: No pedal edema Skin: no  Scalp      Rt knee        Left knee    Left leg    Neuro: grossly non focal , awake, alert and oriented * 3  Psych: Calm, cooperative  Lab Results HIV 1 RNA Quant (Copies/mL)  Date Value  08/05/2023 Not Detected  03/25/2023 Not Detected  03/13/2022 Not Detected   CD4 T Cell Abs (/uL)  Date Value  08/05/2023 726  03/25/2023 1,039  03/13/2022 793   No results found for: HIV1GENOSEQ Lab Results  Component Value Date   WBC 7.5 08/05/2023   HGB 15.5 08/05/2023   HCT 46.1 08/05/2023   MCV 87.0 08/05/2023   PLT 394 08/05/2023    Lab Results   Component Value Date   CREATININE 1.06 08/05/2023   BUN 11 08/05/2023   NA 137 08/05/2023   K 3.9 08/05/2023   CL 101 08/05/2023   CO2 26 08/05/2023   Lab Results  Component Value Date   ALT 22 08/05/2023   AST 20 08/05/2023   BILITOT 0.6 08/05/2023    Lab Results  Component Value Date   CHOL 198 08/05/2023   TRIG 203 (H) 08/05/2023   HDL 27 (L) 08/05/2023   LDLCALC 137 (H) 08/05/2023   Lab Results  Component Value Date   HAV REACTIVE (A) 08/03/2018   Lab Results  Component Value Date   HEPBSAG NON-REACTIVE 08/03/2018   HEPBSAB REACTIVE (A) 08/03/2018   No results found for: HCVAB Lab Results  Component Value Date   CHLAMYDIAWP Negative 11/01/2020   N Negative 11/01/2020   No results found for: GCPROBEAPT No results found for: QUANTGOLD    Health Maintenance: Immunization History  Administered Date(s) Administered   Influenza Inj Mdck Quad Pf 03/22/2020   Influenza, Seasonal, Injecte, Preservative Fre 03/25/2023   Influenza,inj,Quad PF,6+ Mos 03/15/2019, 02/22/2021   Meningococcal Mcv4o 08/25/2018   Moderna Sars-Covid-2 Vaccination 10/29/2019, 11/26/2019, 06/17/2020   Pfizer(Comirnaty)Fall Seasonal Vaccine 12 years and older 03/25/2023   Pneumococcal Conjugate-13 08/25/2018   Pneumococcal Polysaccharide-23 05/30/2021   Tdap 07/10/2018    Assessment/Plan: # HIV - continue descovy , tivcay as is, has enough refills  - labs today  - fu in 5 -6 months   # Rash in multiple sites  - rash in the scalp seems to have started sometime in 03/2022 when seen by Dr Efrain - has attempted antifungals for concerns of tinea capitis ( clotrimazole  1% cream,PO fluconazole  200mg  po daily for 14 days, clotrimazole -betamethasone  cream) in the past with no complete resolution  - Reports h/o psoriasis and unclear if rash is related  - HIV is well controlled to suspect opportunistic infection, fungal etiology - Fu with Dermatology. Will also place referral to  Rheumatology  # STD Screening  - no acute concerns  - urine GC and RPR  # Immunization  - Menveo # 2 today   # Health maintenance - Discussed about dental fu - Colon ca screening discussed and he will fu with his PCP  Patient's labs were reviewed as well as  his previous records. Patients questions were addressed and answered. Safe sex counseling done.   I have personally spent 60 minutes involved in face-to-face and non-face-to-face activities for this patient on the day of the visit. Professional time spent includes the following activities: Preparing to see the patient (review of tests), Obtaining and/or reviewing separately obtained history (admission/discharge record), Performing a medically appropriate examination and/or evaluation , Ordering medications/tests/procedures, referring and communicating with other health care professionals, Documenting clinical information in the EMR, Independently interpreting results (not separately reported), Communicating results to the patient/family/caregiver, Counseling and educating the patient/family/caregiver and Care coordination (not separately reported).   Of note, portions of this note may have been created with voice recognition software. While this note has been edited for accuracy, occasional wrong-word or 'sound-a-like' substitutions may have occurred due to the inherent limitations of voice recognition software.   Electronically signed by:  Annalee Orem, MD Infectious Disease Physician [facility] [address] [place], [place] [address] Phone: [phone]  Fax: [phone]

## 2024-01-30 LAB — T-HELPER CELLS (CD4) COUNT (NOT AT ARMC)
CD4 % Helper T Cell: 47 % (ref 33–65)
CD4 T Cell Abs: 700 /uL (ref 400–1790)

## 2024-02-02 ENCOUNTER — Encounter: Payer: Self-pay | Admitting: Infectious Diseases

## 2024-02-02 ENCOUNTER — Other Ambulatory Visit: Payer: Self-pay | Admitting: Infectious Diseases

## 2024-02-02 LAB — COMPREHENSIVE METABOLIC PANEL WITH GFR
AG Ratio: 0.9 (calc) — ABNORMAL LOW (ref 1.0–2.5)
ALT: 31 U/L (ref 9–46)
AST: 44 U/L — ABNORMAL HIGH (ref 10–35)
Albumin: 4 g/dL (ref 3.6–5.1)
Alkaline phosphatase (APISO): 72 U/L (ref 35–144)
BUN: 12 mg/dL (ref 7–25)
CO2: 26 mmol/L (ref 20–32)
Calcium: 9.4 mg/dL (ref 8.6–10.3)
Chloride: 98 mmol/L (ref 98–110)
Creat: 1.24 mg/dL (ref 0.70–1.30)
Globulin: 4.6 g/dL — ABNORMAL HIGH (ref 1.9–3.7)
Glucose, Bld: 109 mg/dL — ABNORMAL HIGH (ref 65–99)
Potassium: 4.4 mmol/L (ref 3.5–5.3)
Sodium: 135 mmol/L (ref 135–146)
Total Bilirubin: 0.8 mg/dL (ref 0.2–1.2)
Total Protein: 8.6 g/dL — ABNORMAL HIGH (ref 6.1–8.1)
eGFR: 70 mL/min/1.73m2 (ref >=60)

## 2024-02-02 LAB — RPR: RPR Ser Ql: NONREACTIVE

## 2024-02-02 LAB — HIV RNA, RTPCR W/R GT (RTI, PI,INT)
HIV 1 RNA Quant: NOT DETECTED {copies}/mL
HIV-1 RNA Quant, Log: NOT DETECTED {Log_copies}/mL

## 2024-02-02 MED ORDER — CLOBETASOL PROPIONATE 0.05 % EX CREA: 1.0000 | TOPICAL_CREAM | Freq: Two times a day (BID) | CUTANEOUS | 0 refills | Status: DC

## 2024-02-03 ENCOUNTER — Ambulatory Visit: Payer: Self-pay | Admitting: Infectious Diseases

## 2024-02-03 ENCOUNTER — Other Ambulatory Visit: Payer: Self-pay

## 2024-02-03 ENCOUNTER — Ambulatory Visit: Payer: Medicaid Other | Admitting: Internal Medicine

## 2024-02-03 NOTE — Progress Notes (Signed)
 Specialty Pharmacy Refill Coordination Note  Mitchell Meyers is a 53 y.o. male contacted today regarding refills of specialty medication(s) Dolutegravir  Sodium (Tivicay )   Patient requested Delivery   Delivery date: 02/05/24   Verified address: Patient address [address]  [place] [address]   Medication will be filled on 02/04/24.

## 2024-02-10 NOTE — Progress Notes (Deleted)
 Office Visit Note  Patient: Mitchell Meyers             Date of Birth: 14-Nov-1970           MRN: [id number]             PCP: Freddrick, No Referring: Dea Shiner, MD Visit Date: 02/16/2024 Occupation: @GUAROCC @  Subjective:    History of Present Illness: Mitchell Meyers is a 53 y.o. male who presents today for a new patient consultation.      Activities of Daily Living:  Patient reports morning stiffness for *** {minute/hour:19697}.   Patient {ACTIONS;DENIES/REPORTS:21021675::Denies} nocturnal pain.  Difficulty dressing/grooming: {ACTIONS;DENIES/REPORTS:21021675::Denies} Difficulty climbing stairs: {ACTIONS;DENIES/REPORTS:21021675::Denies} Difficulty getting out of chair: {ACTIONS;DENIES/REPORTS:21021675::Denies} Difficulty using hands for taps, buttons, cutlery, and/or writing: {ACTIONS;DENIES/REPORTS:21021675::Denies}  No Rheumatology ROS completed.   PMFS History:  Patient Active Problem List   Diagnosis Date Noted   Screening for STDs (sexually transmitted diseases) 01/29/2024   HIV disease (HCC) 01/29/2024   Rash 01/29/2024   Encounter for long-term (current) use of high-risk medication 08/05/2023   Medication monitoring encounter 08/05/2023   Tinea capitis 11/09/2021   Asthmatic bronchitis , chronic (HCC) 09/15/2020   Epigastric burning sensation 02/28/2020   Screening for colorectal cancer 02/28/2020   Osteoarthritis 03/15/2019   Flu vaccine need 03/15/2019   Human immunodeficiency virus (HIV) disease (HCC) 08/25/2018    Past Medical History:  Diagnosis Date   HIV (human immunodeficiency virus infection) (HCC)    Hypertension     Family History  Problem Relation Age of Onset   COPD Mother    Cancer Father    Arthritis Father    Alcohol  abuse Father    Arthritis Sister    Bipolar disorder Sister    Hypertension Brother    Hepatitis C Brother    Healthy Daughter    Healthy Daughter    Past Surgical History:  Procedure Laterality Date    APPENDECTOMY  1999   Social History   Social History Narrative   Not on file   Immunization History  Administered Date(s) Administered   Influenza Inj Mdck Quad Pf 03/22/2020   Influenza, Seasonal, Injecte, Preservative Fre 03/25/2023   Influenza,inj,Quad PF,6+ Mos 03/15/2019, 02/22/2021   Meningococcal Mcv4o 08/25/2018, 01/29/2024   Moderna Sars-Covid-2 Vaccination 10/29/2019, 11/26/2019, 06/17/2020   Pfizer(Comirnaty)Fall Seasonal Vaccine 12 years and older 03/25/2023   Pneumococcal Conjugate-13 08/25/2018   Pneumococcal Polysaccharide-23 05/30/2021   Tdap 07/10/2018     Objective: Vital Signs: There were no vitals taken for this visit.   Physical Exam Vitals and nursing note reviewed.  Constitutional:      Appearance: He is well-developed.  HENT:     Head: Normocephalic and atraumatic.  Eyes:     Conjunctiva/sclera: Conjunctivae normal.     Pupils: Pupils are equal, round, and reactive to light.  Cardiovascular:     Rate and Rhythm: Normal rate and regular rhythm.     Heart sounds: Normal heart sounds.  Pulmonary:     Effort: Pulmonary effort is normal.     Breath sounds: Normal breath sounds.  Abdominal:     General: Bowel sounds are normal.     Palpations: Abdomen is soft.  Musculoskeletal:     Cervical back: Normal range of motion and neck supple.  Skin:    General: Skin is warm and dry.     Capillary Refill: Capillary refill takes less than 2 seconds.  Neurological:     Mental Status: He is alert and oriented to person, place, and  time.  Psychiatric:        Behavior: Behavior normal.      Musculoskeletal Exam: ***  CDAI Exam: CDAI Score: -- Patient Global: --; Provider Global: -- Swollen: --; Tender: -- Joint Exam 02/16/2024   No joint exam has been documented for this visit   There is currently no information documented on the homunculus. Go to the Rheumatology activity and complete the homunculus joint exam.  Investigation: No additional  findings.  Imaging: No results found.  Recent Labs: Lab Results  Component Value Date   WBC 7.5 08/05/2023   HGB 15.5 08/05/2023   PLT 394 08/05/2023   NA 135 01/29/2024   K 4.4 01/29/2024   CL 98 01/29/2024   CO2 26 01/29/2024   GLUCOSE 109 (H) 01/29/2024   BUN 12 01/29/2024   CREATININE 1.24 01/29/2024   BILITOT 0.8 01/29/2024   AST 44 (H) 01/29/2024   ALT 31 01/29/2024   PROT 8.6 (H) 01/29/2024   CALCIUM 9.4 01/29/2024   GFRAA 89 04/08/2019   QFTBGOLDPLUS NEGATIVE 08/03/2018    Speciality Comments: No specialty comments available.  Procedures:  No procedures performed Allergies: Patient has no known allergies.   Assessment / Plan:     Visit Diagnoses: Psoriatic arthritis (HCC)  Tinea capitis  Human immunodeficiency virus (HIV) disease (HCC)  Encounter for long-term (current) use of high-risk medication  Asthmatic bronchitis , chronic (HCC)  Orders: No orders of the defined types were placed in this encounter.  No orders of the defined types were placed in this encounter.   Face-to-face time spent with patient was *** minutes. Greater than 50% of time was spent in counseling and coordination of care.  Follow-Up Instructions: No follow-ups on file.   Waddell CHRISTELLA Craze, PA-C  Note - This record has been created using Dragon software.  Chart creation errors have been sought, but may not always  have been located. Such creation errors do not reflect on  the standard of medical care.

## 2024-02-16 ENCOUNTER — Encounter: Admitting: Physician Assistant

## 2024-02-16 DIAGNOSIS — J4489 Other specified chronic obstructive pulmonary disease: Secondary | ICD-10-CM

## 2024-02-16 DIAGNOSIS — Z79899 Other long term (current) drug therapy: Secondary | ICD-10-CM

## 2024-02-16 DIAGNOSIS — B20 Human immunodeficiency virus [HIV] disease: Secondary | ICD-10-CM

## 2024-02-16 DIAGNOSIS — B350 Tinea barbae and tinea capitis: Secondary | ICD-10-CM

## 2024-02-16 DIAGNOSIS — L4050 Arthropathic psoriasis, unspecified: Secondary | ICD-10-CM

## 2024-02-20 ENCOUNTER — Other Ambulatory Visit: Payer: Self-pay | Admitting: Infectious Diseases

## 2024-02-25 MED ORDER — CLOBETASOL PROPIONATE 0.05 % EX CREA: 1.0000 | TOPICAL_CREAM | Freq: Two times a day (BID) | CUTANEOUS | 2 refills | Status: DC

## 2024-02-26 ENCOUNTER — Other Ambulatory Visit: Payer: Self-pay

## 2024-03-08 ENCOUNTER — Other Ambulatory Visit: Payer: Self-pay

## 2024-03-10 ENCOUNTER — Other Ambulatory Visit: Payer: Self-pay

## 2024-04-01 ENCOUNTER — Encounter: Payer: Self-pay | Admitting: Infectious Diseases

## 2024-04-01 ENCOUNTER — Other Ambulatory Visit: Payer: Self-pay | Admitting: Infectious Diseases

## 2024-04-01 MED ORDER — CLOBETASOL PROPIONATE 0.05 % EX CREA: 1.0000 | TOPICAL_CREAM | Freq: Two times a day (BID) | CUTANEOUS | 2 refills | Status: AC

## 2024-04-07 ENCOUNTER — Other Ambulatory Visit: Payer: Self-pay | Admitting: Medical Genetics

## 2024-04-07 DIAGNOSIS — Z006 Encounter for examination for normal comparison and control in clinical research program: Secondary | ICD-10-CM

## 2024-04-26 ENCOUNTER — Other Ambulatory Visit: Payer: Self-pay

## 2024-04-26 NOTE — Progress Notes (Signed)
 Specialty Pharmacy Refill Coordination Note  Mitchell Meyers is a 53 y.o. male contacted today regarding refills of specialty medication(s) Dolutegravir  Sodium (Tivicay ); Emtricitabine -Tenofovir  AF (Descovy )   Patient requested Delivery   Delivery date: 04/28/24   Verified address: Patient address [address]  [place]  [id number]   Medication will be filled on: 04/27/24

## 2024-05-20 ENCOUNTER — Other Ambulatory Visit: Payer: Self-pay

## 2024-05-21 LAB — GENECONNECT MOLECULAR SCREEN: Genetic Analysis Overall Interpretation: NEGATIVE

## 2024-05-24 ENCOUNTER — Other Ambulatory Visit: Payer: Self-pay

## 2024-05-26 ENCOUNTER — Other Ambulatory Visit: Payer: Self-pay

## 2024-06-18 NOTE — Progress Notes (Signed)
 The 10-year ASCVD risk score (Arnett DK, et al., 2019) is: 11.7%   Values used to calculate the score:     Age: 54 years     Clinically relevant sex: Male     Is Non-Hispanic African American: No     Diabetic: No     Tobacco smoker: No     Systolic Blood Pressure: 143 mmHg     Is BP treated: Yes     HDL Cholesterol: 39 mg/dL     Total Cholesterol: 250 mg/dL  Currently prescribed atorvastatin 20 mg. UNCLEAR IF PATIENT IS FILLING.  Rochel Privett, BSN, RN

## 2024-07-28 ENCOUNTER — Ambulatory Visit: Admitting: Physician Assistant
